# Patient Record
Sex: Female | Born: 1970 | Race: White | Hispanic: No | Marital: Single | Smoking: Current every day smoker
Health system: Southern US, Community
[De-identification: ages and names within clinical notes are randomized; demographics above are authoritative.]

---

## 2005-05-04 ENCOUNTER — Emergency Department (HOSPITAL_COMMUNITY): Admission: EM | Admit: 2005-05-04 | Discharge: 2005-05-04 | Payer: Self-pay | Admitting: Emergency Medicine

## 2009-10-29 ENCOUNTER — Ambulatory Visit (HOSPITAL_COMMUNITY): Admission: RE | Admit: 2009-10-29 | Discharge: 2009-10-29 | Payer: Self-pay | Admitting: Obstetrics & Gynecology

## 2009-11-08 ENCOUNTER — Encounter: Admission: RE | Admit: 2009-11-08 | Discharge: 2009-11-08 | Payer: Self-pay | Admitting: Obstetrics & Gynecology

## 2010-08-14 ENCOUNTER — Inpatient Hospital Stay (HOSPITAL_COMMUNITY): Admission: AD | Admit: 2010-08-14 | Discharge: 2010-08-14 | Payer: Self-pay | Admitting: Obstetrics and Gynecology

## 2010-08-27 ENCOUNTER — Ambulatory Visit (HOSPITAL_COMMUNITY): Admission: RE | Admit: 2010-08-27 | Discharge: 2010-08-27 | Payer: Self-pay | Admitting: Obstetrics and Gynecology

## 2010-11-06 ENCOUNTER — Ambulatory Visit (HOSPITAL_COMMUNITY)
Admission: RE | Admit: 2010-11-06 | Discharge: 2010-11-06 | Payer: Self-pay | Source: Home / Self Care | Admitting: Obstetrics and Gynecology

## 2010-12-04 ENCOUNTER — Ambulatory Visit (HOSPITAL_COMMUNITY)
Admission: RE | Admit: 2010-12-04 | Discharge: 2010-12-04 | Payer: Self-pay | Source: Home / Self Care | Admitting: Obstetrics and Gynecology

## 2011-01-22 ENCOUNTER — Inpatient Hospital Stay (HOSPITAL_COMMUNITY)
Admission: AD | Admit: 2011-01-22 | Discharge: 2011-01-26 | Payer: Self-pay | Source: Home / Self Care | Attending: Obstetrics and Gynecology | Admitting: Obstetrics and Gynecology

## 2011-01-22 LAB — CBC
HCT: 34.2 % — ABNORMAL LOW (ref 36.0–46.0)
Hemoglobin: 11.9 g/dL — ABNORMAL LOW (ref 12.0–15.0)
MCHC: 34.8 g/dL (ref 30.0–36.0)
MCV: 90 fL (ref 78.0–100.0)
RDW: 13.6 % (ref 11.5–15.5)

## 2011-01-24 LAB — CBC
HCT: 29.1 % — ABNORMAL LOW (ref 36.0–46.0)
MCHC: 34.7 g/dL (ref 30.0–36.0)
MCV: 90.1 fL (ref 78.0–100.0)
Platelets: 244 10*3/uL (ref 150–400)
WBC: 28.3 10*3/uL — ABNORMAL HIGH (ref 4.0–10.5)

## 2011-01-26 LAB — CBC
MCHC: 34.4 g/dL (ref 30.0–36.0)
Platelets: 288 10*3/uL (ref 150–400)
RBC: 3.13 MIL/uL — ABNORMAL LOW (ref 3.87–5.11)
WBC: 11.5 10*3/uL — ABNORMAL HIGH (ref 4.0–10.5)

## 2011-01-28 NOTE — H&P (Signed)
Breanna Rose, Breanna Rose            ACCOUNT NO.:  [id number]  MEDICAL RECORD NO.:  [id number]          PATIENT TYPE:  INP  LOCATION:  9102                          F[facility]ILITY:  [facility]  PHYSICIAN:  Janine Limbo, M.D.DATE OF BIRTH:  August 18, 1971  DATE OF ADMISSION:  01/22/2011 DATE OF DISCHARGE:                             HISTORY & PHYSICAL   HISTORY OF PRESENT ILLNESS:  Breanna Rose is a 40 year old female, gravida 2, para 1-0-1-1, who presents at 48 weeks and 4 days' gestation ([facility] is January 25, 2011).  The patient has been followed at the [facility] and Gynecology Division of [facility] for women.  The patient is sent for induction of labor because of a deceleration on her nonstress test and because of polyhydramnios.  The patient's risk factors include her age of 77.  She did have a quad screen which showed an increased risk of Down syndrome.  She declined amniocentesis.  She was noted to have polyhydramnios in the third trimester.  Her antenatal testing has been normal until today when she was found to have a deceleration.  The patient was sent for induction. The patient is Rh negative and she received RhoGAM at 28 weeks' gestation.  She is a cigarette smoker.  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  She was treated at the Maternity Admissions area on August 09, 2010.  Her initial titer was 1:1.  She then had a nonreactive RPR on November 06, 2010.  The patient admits to regular cigarette use and daily marijuana use.  She has a history of depression and may need social stresses.  OBSTETRICAL HISTORY:  The patient had a vaginal delivery at term in 1990, where she delivered a 7-pound 5-ounce female infant.  DRUG ALLERGIES:  No known drug allergies.  SOCIAL HISTORY:  The patient admits to regular marijuana use and cigarette use.  REVIEW OF SYSTEMS:  Normal pregnancy complaints.  FAMILY HISTORY:   Noncontributory.  PHYSICAL EXAMINATION:  HEENT:  Within normal limits. CHEST:  Clear. HEART:  Regular rate and rhythm. BREASTS:  Without masses. ABDOMEN:  Gravid. EXTREMITIES:  Grossly normal. NEUROLOGIC:  Grossly normal. PELVIC:  Cervix is 3-cm dilated, 80% effaced, and -3 in station.  Fetal heart rate tracing is category one.  The patient is having irregular and mild contractions.  LABORATORY VALUES:  Blood type is A negative, antibody screen negative, VDRL is nonreactive, rubella is positive, HBsAg negative, HIV is negative and beta strep in the third trimester is negative.  ASSESSMENT: 1. A 39-week and 4-day gestation. 2. Polyhydramnios. 3. Fetal heart rate tracing deceleration in the office, but now     category one tracing. 4. Positive syphilis test earlier in the pregnancy, but now negative. 5. Regular cigarette and marijuana use. 6. Multiple social stresses.  PLAN:  The patient will undergo induction of labor.  We will begin Pitocin and then rupture her membranes once she is having steady contractions.  We will obtain a Child psychotherapist consult prior to discharge.     Janine Limbo, M.D.     AVS/MEDQ  D:  01/22/2011  T:  01/23/2011  Job:  [id number]  Electronically Signed by Kirkland Hun M.D. on 01/28/2011 12:10:42 PM

## 2011-01-28 NOTE — Op Note (Signed)
Breanna Rose, Breanna Rose            ACCOUNT NO.:  [id number]  MEDICAL RECORD NO.:  [id number]          PATIENT TYPE:  INP  LOCATION:  9102                          [facility]  [facility]  PHYSICIAN:  Janine Limbo, M.D.DATE OF BIRTH:  12-01-1971  DATE OF PROCEDURE:  01/23/2011 DATE OF DISCHARGE:                              OPERATIVE REPORT   PREOPERATIVE DIAGNOSES: 1. Term intrauterine gestation. 2. Failure to progress in labor. 3. Desires sterilization.  POSTOPERATIVE DIAGNOSES: 1. Term intrauterine gestation. 2. Failure to progress in labor. 3. Desires sterilization. 4. Left occiput transverse presentation.  PROCEDURES:  Primary low transverse cesarean section and bilateral tubal ligation.  SURGEON:  Janine Limbo, MD  FIRST ASSISTANT:  Dorathy Kinsman, CNM  ANESTHETIC:  Epidural.  DISPOSITION:  Ms. Baldonado is a 40 year old female, gravida 2, para 1-0- 0-1, who presents at 16 weeks and 4 days' gestation ([facility] is January 25, 2011).  The patient has been followed at the [facility] and Gynecology Division of [facility].  The pregnancy has been complicated by the fact that her age is greater than 41 years.  The patient was treated for syphilis during the pregnancy. She was also treated for a positive RPR during this labor.  The patient was noted to have polyhydramnios.  She was noted to have fetal heart rate decelerations at antenatal testing.  The decision was made to admit the patient for induction of labor on January 22, 2011.  The patient was started on Pitocin.  Her membranes were ruptured.  She progressed very slowly during her labor.  She was noted to have edema on her cervix. She was also noted to have variable and occasional late decelerations. She at one point had dilated her cervix to 8-9 cm and the fetal head was at a +1 station.  The edematous cervix began to expand.  At 4:41 a.m., the cervix was noted to be 7-8 cm  dilated but the edematous cervix had progressed to the point that the cervix was almost palpable at the introitus and edema was noted around the full circumference of the cervix.  A cesarean delivery was then recommended.  The risk and benefits were reviewed which include, but are not limited to, anesthetic complications, bleeding, infections, and possible damage to the surrounding organs.  We also discussed the benefits and the risk associated with tubal sterilization.  She accepted the risk of tubal failure (17/1000).  FINDINGS:  A 7-pound female infant Renaldo Fiddler) was delivered from a left occiput transverse presentation.  The Apgar scores were 8 at 1 minute and 9 at 5 minutes.  A nuchal cord was present.  The placenta was noted to have meconium staining.  The fallopian tubes, the ovaries, and the uterus were otherwise normal for the gravid state.  PROCEDURE IN DETAILS:  The patient was taken to the operating room where it was determined that the epidural she had received for labor would be adequate for cesarean delivery.  The patient's abdomen was prepped with multiple layers of ChloraPrep.  A Foley catheter had previously been placed.  The patient was sterilely draped.  The lower abdomen was  injected with 10 mL of 0.5% Marcaine.  A low transverse incision was made in the abdomen and it was carried sharply through the subcutaneous tissue, the fascia, and the anterior peritoneum.  An incision was made in the lower uterine segment and extended in a low-transverse fashion. The fetal head was delivered without difficulty.  The mouth and nose were suctioned.  The nuchal cord was reduced.  The remainder of the infant was then delivered.  The cord was clamped and cut and the infant was handed to the awaiting pediatric team.  Routine cord blood studies were obtained.  The placenta was removed.  The uterine cavity was cleaned of amniotic fluid, clotted blood, and membranes.  The  uterine incision was closed using a running locking suture of 2-0 Vicryl followed by an imbricating suture of 2-0 Vicryl.  Hemostasis was achieved using a figure-of-eight suture of 2-0 Vicryl.  Hemostasis was noted to be adequate.  The abdominal cavity was then vigorously irrigated.  The left fallopian tube was identified and followed to its fimbriated end.  A knuckle of tube was made on the left using 2 free ties of 0 plain catgut suture.  The knuckle of tube thus made was excised.  Hemostasis was adequate.  An identical procedure was carried out on the opposite side.  Again hemostasis was adequate.  The anterior peritoneum and abdominal musculature were then reapproximated in the midline using 2-0 Vicryl.  The subcutaneous layer and the fascia were irrigated.  The fascia was closed using a running suture of 0 Vicryl followed by 3 interrupted sutures of 0 Vicryl.  The subcutaneous layer was closed using a running suture of 0 Vicryl.  The skin was reapproximated using a subcuticular suture of 3-0 Monocryl.  A pressure dressing was applied.  Sponge, needle, and instrument counts were correct on 2 occasions.  The estimated blood loss for the procedure was 800 mL.  The patient tolerated her procedure well.  The patient was then transported to the recovery room in stable condition.  The infant was taken to the full-term nursery in stable condition.  The cut portions of the left and right fallopian tube and the placenta was sent to Pathology for evaluation.     Janine Limbo, M.D.     AVS/MEDQ  D:  01/23/2011  T:  01/23/2011  Job:  [id number]  Electronically Signed by Kirkland Hun M.D. on 01/28/2011 12:10:51 PM

## 2011-03-11 LAB — RH IMMUNE GLOBULIN WORKUP (NOT WOMEN'S HOSP)
Antibody Screen: NEGATIVE
Unit division: 0
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
## 2014-03-20 ENCOUNTER — Ambulatory Visit
Admission: RE | Admit: 2014-03-20 | Discharge: 2014-03-20 | Disposition: A | Discharge status: Home / Self Care | Payer: BC Managed Care – PPO | Source: Ambulatory Visit | Attending: Family Medicine | Admitting: Family Medicine

## 2014-03-20 ENCOUNTER — Other Ambulatory Visit: Payer: Self-pay | Admitting: Family Medicine

## 2014-03-20 DIAGNOSIS — R609 Edema, unspecified: Secondary | ICD-10-CM

## 2014-03-20 DIAGNOSIS — S6990XA Unspecified injury of unspecified wrist, hand and finger(s), initial encounter: Secondary | ICD-10-CM

## 2014-04-14 ENCOUNTER — Other Ambulatory Visit: Payer: Self-pay | Admitting: Family Medicine

## 2014-04-14 DIAGNOSIS — Z1231 Encounter for screening mammogram for malignant neoplasm of breast: Secondary | ICD-10-CM

## 2014-04-24 ENCOUNTER — Encounter (INDEPENDENT_AMBULATORY_CARE_PROVIDER_SITE_OTHER): Payer: Self-pay

## 2014-04-24 ENCOUNTER — Ambulatory Visit
Admission: RE | Admit: 2014-04-24 | Discharge: 2014-04-24 | Disposition: A | Discharge status: Home / Self Care | Payer: BC Managed Care – PPO | Source: Ambulatory Visit | Attending: Family Medicine | Admitting: Family Medicine

## 2014-04-24 DIAGNOSIS — Z1231 Encounter for screening mammogram for malignant neoplasm of breast: Secondary | ICD-10-CM

## 2014-04-26 ENCOUNTER — Other Ambulatory Visit: Payer: Self-pay | Admitting: Family Medicine

## 2014-04-26 DIAGNOSIS — R928 Other abnormal and inconclusive findings on diagnostic imaging of breast: Secondary | ICD-10-CM

## 2014-05-08 ENCOUNTER — Ambulatory Visit
Admission: RE | Admit: 2014-05-08 | Discharge: 2014-05-08 | Disposition: A | Discharge status: Home / Self Care | Payer: BC Managed Care – PPO | Source: Ambulatory Visit | Attending: Family Medicine | Admitting: Family Medicine

## 2014-05-08 DIAGNOSIS — R928 Other abnormal and inconclusive findings on diagnostic imaging of breast: Secondary | ICD-10-CM

## 2014-10-26 ENCOUNTER — Emergency Department (HOSPITAL_COMMUNITY)
Admission: EM | Admit: 2014-10-26 | Discharge: 2014-10-26 | Disposition: A | Discharge status: Home / Self Care | Payer: BC Managed Care – PPO | Attending: Emergency Medicine | Admitting: Emergency Medicine

## 2014-10-26 ENCOUNTER — Encounter (HOSPITAL_COMMUNITY): Payer: Self-pay | Admitting: Emergency Medicine

## 2014-10-26 DIAGNOSIS — J209 Acute bronchitis, unspecified: Secondary | ICD-10-CM | POA: Insufficient documentation

## 2014-10-26 DIAGNOSIS — R05 Cough: Secondary | ICD-10-CM | POA: Diagnosis present

## 2014-10-26 DIAGNOSIS — J40 Bronchitis, not specified as acute or chronic: Secondary | ICD-10-CM

## 2014-10-26 DIAGNOSIS — Z79899 Other long term (current) drug therapy: Secondary | ICD-10-CM | POA: Diagnosis not present

## 2014-10-26 DIAGNOSIS — Z720 Tobacco use: Secondary | ICD-10-CM | POA: Insufficient documentation

## 2014-10-26 MED ORDER — PREDNISONE 50 MG PO TABS: ORAL_TABLET | ORAL | Status: DC

## 2014-10-26 MED ORDER — ALBUTEROL SULFATE HFA 108 (90 BASE) MCG/ACT IN AERS: 1.0000 | INHALATION_SPRAY | Freq: Four times a day (QID) | RESPIRATORY_TRACT | Status: DC | PRN

## 2014-10-26 MED ORDER — HYDROCODONE-HOMATROPINE 5-1.5 MG/5ML PO SYRP: 5.0000 mL | ORAL_SOLUTION | Freq: Four times a day (QID) | ORAL | Status: DC | PRN

## 2014-10-26 MED ORDER — AZITHROMYCIN 250 MG PO TABS: ORAL_TABLET | ORAL | Status: DC

## 2014-10-26 NOTE — ED Provider Notes (Signed)
CSN: [id number]     Arrival date & time 10/26/14  [id number] History  This chart was scribed for Donnetta HutchingBrian Deklyn Trachtenberg, MD by Tonye RoyaltyJoshua Chen, ED Scribe. This patient was seen in room APA01/APA01 and the patient's care was started at 8:50 AM.    Chief Complaint  Patient presents with  . Cough   The history is provided by the patient. No language interpreter was used.    HPI Comments: Breanna Rose is a 43 y.o. female who presents to the Emergency Department complaining of congestion and cough with onset 1 week ago. She reports associated wheezing and shortness of breath. She states she is able to ambulate but feels weak. She states she is normally healthy and not prone to respiratory infections, though she smokes 1 pack of cigarettes a day. She states she is not working now but used to work for a [employer] company. She reports using cough medicine at home without improvement. She states she also has been using penicillin for the past few weeks because she had some teeth extracted.   History reviewed. No pertinent past medical history. Past Surgical History  Procedure Laterality Date  . Cesarean section     History reviewed. No pertinent family history. History  Substance Use Topics  . Smoking status: Never Smoker   . Smokeless tobacco: Not on file  . Alcohol Use: No   OB History   Grav Para Term Preterm Abortions TAB SAB Ect Mult Living                 Review of Systems A complete 10 system review of systems was obtained and all systems are negative except as noted in the HPI and PMH.    Allergies  Review of patient's allergies indicates no known allergies.  Home Medications   Prior to Admission medications   Medication Sig Start Date End Date Taking? Authorizing Provider  albuterol (PROVENTIL HFA;VENTOLIN HFA) 108 (90 BASE) MCG/ACT inhaler Inhale 1-2 puffs into the lungs every 6 (six) hours as needed for wheezing or shortness of breath. 10/26/14   Donnetta HutchingBrian Kiasia Chou, MD  azithromycin  (ZITHROMAX Z-PAK) 250 MG tablet 2 po day one, then 1 daily x 4 days 10/26/14   Donnetta HutchingBrian Shalina Norfolk, MD  HYDROcodone-homatropine [facility](HYCODAN) 5-1.5 MG/5ML syrup Take 5 mLs by mouth every 6 (six) hours as needed for cough. 10/26/14   Donnetta HutchingBrian Breyah Akhter, MD  predniSONE (DELTASONE) 50 MG tablet 1 tablet daily for 7 days 10/26/14   Donnetta HutchingBrian Daleon Willinger, MD   BP 136/73  Pulse 104  Temp(Src) 98.2 F (36.8 C)  Resp 20  Ht 5\' 1"  (1.549 m)  Wt 156 lb (70.761 kg)  BMI 29.49 kg/m2  SpO2 94%  LMP 09/24/2014 Physical Exam  Nursing note and vitals reviewed. Constitutional: She is oriented to person, place, and time. She appears well-developed and well-nourished.  HENT:  Head: Normocephalic and atraumatic.  Eyes: Conjunctivae and EOM are normal. Pupils are equal, round, and reactive to light.  Neck: Normal range of motion. Neck supple.  Cardiovascular: Normal rate, regular rhythm and normal heart sounds.   Pulmonary/Chest: Effort normal. No respiratory distress. She has wheezes (bilateral expiratory wheezes). She has no rales.  Abdominal: Soft. Bowel sounds are normal.  Musculoskeletal: Normal range of motion.  Neurological: She is alert and oriented to person, place, and time.  Skin: Skin is warm and dry.  Psychiatric: She has a normal mood and affect. Her behavior is normal.    ED Course  Procedures (including critical care time)  DIAGNOSTIC STUDIES: Oxygen Saturation is 94% on adequate, normal by my interpretation.    COORDINATION OF CARE: 8:53 AM Discussed treatment plan with patient at beside, including inhaler, antibiotic, and prescription cough syrup. The patient agrees with the plan and has no further questions at this time.   Labs Review Labs Reviewed - No data to display  Imaging Review No results found.   EKG Interpretation None      MDM   Final diagnoses:  Bronchitis    Patient is nontoxic-appearing. No respiratory distress. Normal vital signs. Symptoms have noted for at least one week. Rx  Zithromax, Hycodan cough syrup, albuterol inhaler, prednisone  I personally performed the services described in this documentation, which was scribed in my presence. The recorded information has been reviewed and is accurate.    Donnetta HutchingBrian Zayna Toste, MD 10/26/14 [phone]

## 2014-10-26 NOTE — ED Notes (Signed)
Pt c/o productive cough with yellow sputum and generalized body aches x 1 week.

## 2014-10-26 NOTE — Discharge Instructions (Signed)
Stop smoking. Prescription for antibiotic, inhaler, cough syrup, prednisone. Increase fluids.

## 2016-01-15 ENCOUNTER — Emergency Department (HOSPITAL_COMMUNITY): Payer: No Typology Code available for payment source

## 2016-01-15 ENCOUNTER — Emergency Department (HOSPITAL_COMMUNITY)
Admission: EM | Admit: 2016-01-15 | Discharge: 2016-01-15 | Disposition: A | Discharge status: Home / Self Care | Payer: No Typology Code available for payment source | Attending: Emergency Medicine | Admitting: Emergency Medicine

## 2016-01-15 ENCOUNTER — Encounter (HOSPITAL_COMMUNITY): Payer: Self-pay | Admitting: Emergency Medicine

## 2016-01-15 DIAGNOSIS — Z79899 Other long term (current) drug therapy: Secondary | ICD-10-CM | POA: Diagnosis not present

## 2016-01-15 DIAGNOSIS — S199XXA Unspecified injury of neck, initial encounter: Secondary | ICD-10-CM | POA: Diagnosis present

## 2016-01-15 DIAGNOSIS — R202 Paresthesia of skin: Secondary | ICD-10-CM | POA: Insufficient documentation

## 2016-01-15 DIAGNOSIS — Y92410 Unspecified street and highway as the place of occurrence of the external cause: Secondary | ICD-10-CM | POA: Insufficient documentation

## 2016-01-15 DIAGNOSIS — Y998 Other external cause status: Secondary | ICD-10-CM | POA: Insufficient documentation

## 2016-01-15 DIAGNOSIS — Y9389 Activity, other specified: Secondary | ICD-10-CM | POA: Insufficient documentation

## 2016-01-15 DIAGNOSIS — M542 Cervicalgia: Secondary | ICD-10-CM

## 2016-01-15 MED ORDER — ACETAMINOPHEN-CODEINE #3 300-30 MG PO TABS: 1.0000 | ORAL_TABLET | Freq: Four times a day (QID) | ORAL | Status: DC | PRN

## 2016-01-15 MED ORDER — DEXAMETHASONE 4 MG PO TABS: 4.0000 mg | ORAL_TABLET | Freq: Two times a day (BID) | ORAL | Status: DC

## 2016-01-15 MED ORDER — BACLOFEN 10 MG PO TABS: 10.0000 mg | ORAL_TABLET | Freq: Three times a day (TID) | ORAL | Status: AC
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
## 2016-08-02 ENCOUNTER — Emergency Department (HOSPITAL_COMMUNITY)
Admission: EM | Admit: 2016-08-02 | Discharge: 2016-08-02 | Disposition: A | Discharge status: Home / Self Care | Payer: Self-pay | Attending: Emergency Medicine | Admitting: Emergency Medicine

## 2016-08-02 ENCOUNTER — Encounter (HOSPITAL_COMMUNITY): Payer: Self-pay

## 2016-08-02 DIAGNOSIS — F17210 Nicotine dependence, cigarettes, uncomplicated: Secondary | ICD-10-CM | POA: Insufficient documentation

## 2016-08-02 DIAGNOSIS — M5412 Radiculopathy, cervical region: Secondary | ICD-10-CM | POA: Insufficient documentation

## 2016-08-02 MED ORDER — METHOCARBAMOL 500 MG PO TABS: 500.0000 mg | ORAL_TABLET | Freq: Two times a day (BID) | ORAL | 0 refills | Status: DC

## 2016-08-02 MED ORDER — METHYLPREDNISOLONE 4 MG PO TBPK: ORAL_TABLET | ORAL | 0 refills | Status: DC
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
## 2017-01-29 ENCOUNTER — Other Ambulatory Visit: Payer: Self-pay | Admitting: Family Medicine

## 2017-01-29 DIAGNOSIS — Z1231 Encounter for screening mammogram for malignant neoplasm of breast: Secondary | ICD-10-CM

## 2017-02-10 ENCOUNTER — Ambulatory Visit
Admission: RE | Admit: 2017-02-10 | Discharge: 2017-02-10 | Disposition: A | Discharge status: Home / Self Care | Payer: Managed Care, Other (non HMO) | Source: Ambulatory Visit | Attending: Family Medicine | Admitting: Family Medicine

## 2017-02-10 DIAGNOSIS — Z1231 Encounter for screening mammogram for malignant neoplasm of breast: Secondary | ICD-10-CM
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
## 2019-02-07 ENCOUNTER — Other Ambulatory Visit: Payer: Self-pay | Admitting: General Practice

## 2019-02-07 DIAGNOSIS — Z1231 Encounter for screening mammogram for malignant neoplasm of breast: Secondary | ICD-10-CM

## 2019-02-10 ENCOUNTER — Other Ambulatory Visit (HOSPITAL_BASED_OUTPATIENT_CLINIC_OR_DEPARTMENT_OTHER): Payer: Self-pay

## 2019-02-10 DIAGNOSIS — G4710 Hypersomnia, unspecified: Secondary | ICD-10-CM

## 2019-02-10 DIAGNOSIS — R0683 Snoring: Secondary | ICD-10-CM

## 2019-02-21 ENCOUNTER — Ambulatory Visit: Payer: Managed Care, Other (non HMO) | Attending: General Practice | Admitting: Neurology

## 2019-02-21 DIAGNOSIS — R0683 Snoring: Secondary | ICD-10-CM | POA: Diagnosis not present

## 2019-02-21 DIAGNOSIS — G4710 Hypersomnia, unspecified: Secondary | ICD-10-CM | POA: Diagnosis not present

## 2019-02-28 NOTE — Procedures (Signed)
  [facility] Arti Trang A. Gerilyn Pilgrim, MD     [email or web address]             HOME SLEEP TEST  LOCATION: [facility]  Patient Name: Breanna Rose, Breanna Rose Date: 02/21/2019 Gender: Female D.O.B: Sep 04, 1971 Age (years): 40 Referring Provider: Therisa Doyne NP Height (inches): 62 Interpreting Physician: Beryle Beams MD, ABSM Weight (lbs): 178 RPSGT: Peak, Robert BMI: 33 MRN: [id number] Neck Size: CLINICAL INFORMATION Sleep Study Type: HST     Indication for sleep study: Excessive Daytime Sleepiness, Snoring     Epworth Sleepiness Score: NA  SLEEP STUDY TECHNIQUE A multi-channel overnight portable sleep study was performed. The channels recorded were: nasal airflow, thoracic respiratory movement, and oxygen saturation with a pulse oximetry. Snoring was also monitored.  MEDICATIONS Patient self administered medications include: N/A.  Current Outpatient Medications:  .  acetaminophen-codeine (TYLENOL #3) 300-30 MG tablet, Take 1-2 tablets by mouth every 6 (six) hours as needed for moderate pain., Disp: 15 tablet, Rfl: 0 .  albuterol (PROVENTIL HFA;VENTOLIN HFA) 108 (90 BASE) MCG/ACT inhaler, Inhale 1-2 puffs into the lungs every 6 (six) hours as needed for wheezing or shortness of breath., Disp: 1 Inhaler, Rfl: 0 .  azithromycin (ZITHROMAX Z-PAK) 250 MG tablet, 2 po day one, then 1 daily x 4 days, Disp: 5 tablet, Rfl: 0 .  dexamethasone (DECADRON) 4 MG tablet, Take 1 tablet (4 mg total) by mouth 2 (two) times daily with a meal., Disp: 12 tablet, Rfl: 0 .  HYDROcodone-homatropine (HYCODAN) 5-1.5 MG/5ML syrup, Take 5 mLs by mouth every 6 (six) hours as needed for cough., Disp: 120 mL, Rfl: 0 .  methocarbamol (ROBAXIN) 500 MG tablet, Take 1 tablet (500 mg total) by mouth 2 (two) times daily., Disp: 20 tablet, Rfl: 0 .  methylPREDNISolone (MEDROL DOSEPAK) 4 MG TBPK tablet, Follow instructions on the box, Disp: 21 tablet, Rfl: 0 .  predniSONE (DELTASONE) 50 MG tablet, 1  tablet daily for 7 days, Disp: 7 tablet, Rfl: 0   SLEEP ARCHITECTURE Patient was studied for 427 minutes. The sleep efficiency was 89.0 % and the patient was supine for 3%. The arousal index was 0.0 per hour.  RESPIRATORY PARAMETERS The overall AHI was 1.4 per hour, with a central apnea index of 0.1 per hour.  The oxygen nadir was 83% during sleep.     CARDIAC DATA Mean heart rate during sleep was 73.5 bpm.  IMPRESSIONS No significant obstructive sleep apnea occurred during this study (AHI = 1.4/h).   Argie Ramming, MD Diplomate, American Board of Sleep Medicine. ELECTRONICALLY SIGNED ON:  02/28/2019, 8:37 AM [facility] PH: [phone]   FX: [phone] ACCREDITED BY THE AMERICAN ACADEMY OF SLEEP MEDICINE

## 2019-03-04 ENCOUNTER — Ambulatory Visit
Admission: RE | Admit: 2019-03-04 | Discharge: 2019-03-04 | Disposition: A | Discharge status: Home / Self Care | Payer: Managed Care, Other (non HMO) | Source: Ambulatory Visit | Attending: General Practice | Admitting: General Practice

## 2019-03-04 DIAGNOSIS — Z1231 Encounter for screening mammogram for malignant neoplasm of breast: Secondary | ICD-10-CM

## 2022-09-30 ENCOUNTER — Ambulatory Visit: Payer: Managed Care, Other (non HMO) | Admitting: Nurse Practitioner

## 2022-09-30 ENCOUNTER — Encounter: Payer: Self-pay | Admitting: Nurse Practitioner

## 2022-09-30 VITALS — BP 137/86 | HR 69 | Temp 98.6°F | Ht 62.0 in | Wt 167.0 lb

## 2022-09-30 DIAGNOSIS — Z23 Encounter for immunization: Secondary | ICD-10-CM

## 2022-09-30 DIAGNOSIS — Z0000 Encounter for general adult medical examination without abnormal findings: Secondary | ICD-10-CM | POA: Insufficient documentation

## 2022-09-30 DIAGNOSIS — J301 Allergic rhinitis due to pollen: Secondary | ICD-10-CM | POA: Diagnosis not present

## 2022-09-30 DIAGNOSIS — Z7689 Persons encountering health services in other specified circumstances: Secondary | ICD-10-CM

## 2022-09-30 MED ORDER — CETIRIZINE HCL 10 MG PO TABS: 10.0000 mg | ORAL_TABLET | Freq: Every day | ORAL | 11 refills | Status: DC

## 2022-09-30 MED ORDER — SALINE SPRAY 0.65 % NA SOLN: 1.0000 | NASAL | 6 refills | Status: DC | PRN

## 2022-09-30 NOTE — Assessment & Plan Note (Signed)
Patient is establishing care today.  Completed head to toe assessment, provided education to patient on preventative care.  Patient will schedule an appointment for physical and labs, Pap, mammogram and colonoscopy.

## 2022-09-30 NOTE — Patient Instructions (Signed)
Allergic Rhinitis, Adult  Allergic rhinitis is an allergic reaction that affects the mucous membrane inside the nose. The mucous membrane is the tissue that produces mucus. There are two types of allergic rhinitis: Seasonal. This type is also called hay fever and happens only during certain seasons. Perennial. This type can happen at any time of the year. Allergic rhinitis cannot be spread from person to person. This condition can be mild, moderate, or severe. It can develop at any age and may be outgrown. What are the causes? This condition is caused by allergens. These are things that can cause an allergic reaction. Allergens may differ for seasonal allergic rhinitis and perennial allergic rhinitis. Seasonal allergic rhinitis is triggered by pollen. Pollen can come from grasses, trees, and weeds. Perennial allergic rhinitis may be triggered by: Dust mites. Proteins in a pet's urine, saliva, or dander. Dander is dead skin cells from a pet. Smoke, mold, or car fumes. What increases the risk? You are more likely to develop this condition if you have a family history of allergies or other conditions related to allergies, including: Allergic conjunctivitis. This is inflammation of parts of the eyes and eyelids. Asthma. This condition affects the lungs and makes it hard to breathe. Atopic dermatitis or eczema. This is long term (chronic) inflammation of the skin. Food allergies. What are the signs or symptoms? Symptoms of this condition include: Sneezing or coughing. A stuffy nose (nasal congestion), itchy nose, or nasal discharge. Itchy eyes and tearing of the eyes. A feeling of mucus dripping down the back of your throat (postnasal drip). Trouble sleeping. Tiredness or fatigue. Headache. Sore throat. How is this diagnosed? This condition may be diagnosed with your symptoms, medical history, and physical exam. Your health care provider may check for related conditions, such  as: Asthma. Pink eye. This is eye inflammation caused by infection (conjunctivitis). Ear infection. Upper respiratory infection. This is an infection in the nose, throat, or upper airways. You may also have tests to find out which allergens trigger your symptoms. These may include skin tests or blood tests. How is this treated? There is no cure for this condition, but treatment can help control symptoms. Treatment may include: Taking medicines that block allergy symptoms, such as corticosteroids and antihistamines. Medicine may be given as a shot, nasal spray, or pill. Avoiding any allergens. Being exposed again and again to tiny amounts of allergens to help you build a defense against allergens (immunotherapy). This is done if other treatments have not helped. It may include: Allergy shots. These are injected medicines that have small amounts of allergen in them. Sublingual immunotherapy. This involves taking small doses of a medicine with allergen in it under your tongue. If these treatments do not work, your health care provider may prescribe newer, stronger medicines. Follow these instructions at home: Avoiding allergens Find out what you are allergic to and avoid those allergens. These are some things you can do to help avoid allergens: If you have perennial allergies: Replace carpet with wood, tile, or vinyl flooring. Carpet can trap dander and dust. Do not smoke. Do not allow smoking in your home. Change your heating and air conditioning filters at least once a month. If you have seasonal allergies, take these steps during allergy season: Keep windows closed as much as possible. Plan outdoor activities when pollen counts are lowest. Check pollen counts before you plan outdoor activities. When coming indoors, change clothing and shower before sitting on furniture or bedding. If you have a pet   in the house that produces allergens: Keep the pet out of the bedroom. Vacuum, sweep, and  dust regularly. General instructions Take over-the-counter and prescription medicines only as told by your health care provider. Drink enough fluid to keep your urine pale yellow. Keep all follow-up visits as told by your health care provider. This is important. Where to find more information American Academy of Allergy, Asthma & Immunology: www.aaaai.org Contact a health care provider if: You have a fever. You develop a cough that does not go away. You make whistling sounds when you breathe (wheeze). Your symptoms slow you down or stop you from doing your normal activities each day. Get help right away if: You have shortness of breath. This symptom may represent a serious problem that is an emergency. Do not wait to see if the symptom will go away. Get medical help right away. Call your local emergency services (911 in the [place] Do not drive yourself to the hospital. Summary Allergic rhinitis may be managed by taking medicines as directed and avoiding allergens. If you have seasonal allergies, keep windows closed as much as possible during allergy season. Contact your health care provider if you develop a fever or a cough that does not go away. This information is not intended to replace advice given to you by your health care provider. Make sure you discuss any questions you have with your health care provider. Document Revised: 02/03/2020 Document Reviewed: 12/13/2019 Elsevier Patient Education  2023 Elsevier Inc.  

## 2022-09-30 NOTE — Assessment & Plan Note (Signed)
Patient is establishing care today.  Assessment persistent neurologic rhinitis completed started patient on Zyrtec 10 mg tablet by mouth daily.,  Provided education to patient to increase hydration, saline nasal spray, humidifier or air purifier in the home as needed.  Follow-up with unresolved symptoms.

## 2022-09-30 NOTE — Progress Notes (Signed)
New Patient Note  RE: Breanna Rose MRN: [id number] DOB: 1971/10/26 Date of Office Visit: 09/30/2022  Chief Complaint: Establish Care and Nasal Congestion  History of Present Illness:  Allergic Rhinitis: Breanna Rose is establishing today and here for evaluation of possible allergic rhinitis. Patient's symptoms include nasal congestion. These symptoms are seasonal. Current triggers include exposure to no known precipitant. The patient has been suffering from these symptoms for approximately few days. The patient has tried prescription antihistamines with good relief of symptoms. Immunotherapy has never been tried. The patient has never had nasal polyps. The patient has no history of asthma. The patient does not suffer from frequent sinopulmonary infections. The patient has not had sinus surgery in the past. The patient has no history of eczema.    Assessment and Plan: Breanna Rose is a 51 y.o. female with: Seasonal allergic rhinitis due to pollen Patient is establishing care today.  Assessment persistent neurologic rhinitis completed started patient on Zyrtec 10 mg tablet by mouth daily.,  Provided education to patient to increase hydration, saline nasal spray, humidifier or air purifier in the home as needed.  Follow-up with unresolved symptoms.  Establishing care with new doctor, encounter for Patient is establishing care today.  Completed head to toe assessment, provided education to patient on preventative care.  Patient will schedule an appointment for physical and labs, Pap, mammogram and colonoscopy.  No follow-ups on file.   Diagnostics:   Past Medical History: Patient Active Problem List   Diagnosis Date Noted   Seasonal allergic rhinitis due to pollen 09/30/2022   Establishing care with new doctor, encounter for 09/30/2022   History reviewed. No pertinent past medical history. Past Surgical History: Past Surgical History:  Procedure Laterality Date   CESAREAN SECTION      Medication List:  Current Outpatient Medications  Medication Sig Dispense Refill   cetirizine (ZYRTEC) 10 MG tablet Take 1 tablet (10 mg total) by mouth daily. 30 tablet 11   sodium chloride (OCEAN) 0.65 % SOLN nasal spray Place 1 spray into both nostrils as needed for congestion. 60 mL 6   traMADol (ULTRAM) 50 MG tablet  (Patient not taking: Reported on 09/30/2022)     No current facility-administered medications for this visit.   Allergies: No Known Allergies Social History: Social History   Socioeconomic History   Marital status: Single    Spouse name: Not on file   Number of children: Not on file   Years of education: Not on file   Highest education level: Not on file  Occupational History   Not on file  Tobacco Use   Smoking status: Every Day    Packs/day: 1.00    Types: Cigarettes   Smokeless tobacco: Never  Vaping Use   Vaping Use: Never used  Substance and Sexual Activity   Alcohol use: No   Drug use: Yes    Types: Marijuana   Sexual activity: Yes  Other Topics Concern   Not on file  Social History Narrative   Not on file   Social Determinants of Health   Financial Resource Strain: Not on file  Food Insecurity: Not on file  Transportation Needs: Not on file  Physical Activity: Not on file  Stress: Not on file  Social Connections: Not on file       Family History: History reviewed. No pertinent family history.       Review of Systems  Constitutional: Negative.   HENT:  Positive for congestion and postnasal drip.   Eyes:  Negative.   Respiratory: Negative.    Gastrointestinal: Negative.   Genitourinary: Negative.   Musculoskeletal: Negative.   Skin: Negative.   Neurological: Negative.   Psychiatric/Behavioral: Negative.    All other systems reviewed and are negative.  Objective: BP 137/86   Pulse 69   Temp 98.6 F (37 C)   Ht 5\' 2"  (1.575 m)   Wt 167 lb (75.8 kg)   LMP 01/29/2017 (Exact Date)   SpO2 95%   BMI 30.54 kg/m  Body  mass index is 30.54 kg/m. Physical Exam Vitals and nursing note reviewed.  Constitutional:      Appearance: Normal appearance.  HENT:     Head: Normocephalic.     Right Ear: External ear normal.     Left Ear: External ear normal.     Nose: Congestion present.  Eyes:     Pupils: Pupils are equal, round, and reactive to light.  Cardiovascular:     Rate and Rhythm: Normal rate and regular rhythm.     Pulses: Normal pulses.     Heart sounds: Normal heart sounds.  Pulmonary:     Effort: Pulmonary effort is normal.     Breath sounds: Normal breath sounds.  Abdominal:     General: Bowel sounds are normal.  Skin:    General: Skin is warm.  Neurological:     Mental Status: She is alert and oriented to person, place, and time.  Psychiatric:        Behavior: Behavior normal.    The plan was reviewed with the patient/family, and all questions/concerned were addressed.  It was my pleasure to see Breanna Rose today and participate in her care. Please feel free to contact me with any questions or concerns.  Sincerely,  Jac Canavan NP [place]

## 2022-11-11 ENCOUNTER — Encounter: Payer: Self-pay | Admitting: Nurse Practitioner

## 2022-11-11 ENCOUNTER — Ambulatory Visit (INDEPENDENT_AMBULATORY_CARE_PROVIDER_SITE_OTHER): Payer: 59 | Admitting: Nurse Practitioner

## 2022-11-11 VITALS — BP 109/70 | HR 75 | Temp 98.6°F | Ht 62.0 in | Wt 170.0 lb

## 2022-11-11 DIAGNOSIS — Z0000 Encounter for general adult medical examination without abnormal findings: Secondary | ICD-10-CM | POA: Diagnosis not present

## 2022-11-11 DIAGNOSIS — Z1211 Encounter for screening for malignant neoplasm of colon: Secondary | ICD-10-CM | POA: Diagnosis not present

## 2022-11-11 NOTE — Patient Instructions (Signed)

## 2022-11-11 NOTE — Assessment & Plan Note (Signed)
Patient is in today for physical exam.  Completed head to toe assessment.  No new complaints.  Provided education to patient on health maintenance and preventative care.  All labs completed results pending.  Follow-up in 1 year.

## 2022-11-11 NOTE — Progress Notes (Signed)
Established Patient Office Visit  Subjective   Patient ID: Breanna Rose, female    DOB: [birth year]  Age: 51 y.o. MRN: [id number]  Chief Complaint  Patient presents with   Annual Exam    HPI .   Encounter for general adult medical examination Physical: Patient's last physical exam was 1 year ago .  Weight: Appropriate for height (BMI less than 27%) ;  Blood Pressure: Normal (BP less than 120/80) ;  Medical History: Patient history reviewed ; Family history reviewed ;  Allergies Reviewed: No change in current allergies ;  Medications Reviewed: Medications reviewed - no changes ;  Lipids: Normal lipid levels ; blood completed results pending Smoking: Life-long non-smoker ;  Physical Activity: Exercises at least 3 times per week ;  Alcohol/Drug Use: Is a non-drinker ; No illicit drug use ;  Patient is not afflicted from Stress Incontinence and Urge Incontinence  Safety: reviewed ; Patient wears a seat belt, has smoke detectors, has carbon monoxide detectors. and wears sunscreen with extended sun exposure. Dental Care: biannual cleanings, brushes and flosses daily. Ophthalmology/Optometry: Annual visit.  Hearing loss: none Vision impairments: Patient wears glasses Patient Active Problem List   Diagnosis Date Noted   Seasonal allergic rhinitis due to pollen 09/30/2022   Annual physical exam 09/30/2022   History reviewed. No pertinent past medical history. Past Surgical History:  Procedure Laterality Date   CESAREAN SECTION     Social History   Tobacco Use   Smoking status: Every Day    Packs/day: 1.00    Types: Cigarettes   Smokeless tobacco: Never  Vaping Use   Vaping Use: Never used  Substance Use Topics   Alcohol use: No   Drug use: Yes    Types: Marijuana   Social History   Socioeconomic History   Marital status: Single    Spouse name: Not on file   Number of children: 2   Years of education: Not on file   Highest education level: Not on file   Occupational History   Occupation: Secondary school teacher  Tobacco Use   Smoking status: Every Day    Packs/day: 1.00    Types: Cigarettes   Smokeless tobacco: Never  Vaping Use   Vaping Use: Never used  Substance and Sexual Activity   Alcohol use: No   Drug use: Yes    Types: Marijuana   Sexual activity: Yes  Other Topics Concern   Not on file  Social History Narrative   Not on file   Social Determinants of Health   Financial Resource Strain: Not on file  Food Insecurity: Not on file  Transportation Needs: Not on file  Physical Activity: Not on file  Stress: Not on file  Social Connections: Not on file  Intimate Partner Violence: Not on file   Family Status  Relation Name Status   Mother  Deceased   Father  Alive   Sister  Alive   Daughter  Alive   Daughter  Alive   MGM  Deceased   MGF  Deceased   PGM  Deceased   PGF  Deceased   Other  Deceased   Family History  Problem Relation Age of Onset   Diabetes Mother    Hypertension Mother    Cancer Mother    Hypertension Sister    Diabetes Sister    Heart disease Maternal Grandmother    Diabetes Maternal Grandmother    No Known Allergies    Review of Systems  Constitutional: Negative.  HENT: Negative.  Negative for ear discharge and hearing loss.   Eyes: Negative.   Respiratory: Negative.    Cardiovascular: Negative.   Gastrointestinal: Negative.   Genitourinary: Negative.   Skin: Negative.  Negative for itching and rash.  Neurological: Negative.   Psychiatric/Behavioral: Negative.    All other systems reviewed and are negative.     Objective:     BP 109/70   Pulse 75   Temp 98.6 F (37 C)   Ht _0  (1.575 m)   Wt 170 lb (77.1 kg)   LMP 01/29/2017 (Exact Date)   SpO2 96%   BMI 31.09 kg/m  BP Readings from Last 3 Encounters:  11/11/22 109/70  09/30/22 137/86  08/02/16 123/72   Wt Readings from Last 3 Encounters:  11/11/22 170 lb (77.1 kg)  09/30/22 167 lb (75.8 kg)  08/02/16 162 lb  (73.5 kg)      Physical Exam Vitals and nursing note reviewed.  Constitutional:      Appearance: Normal appearance. She is obese.  HENT:     Head: Normocephalic.     Right Ear: External ear normal. There is no impacted cerumen.     Left Ear: External ear normal.     Nose: Nose normal.     Mouth/Throat:     Mouth: Mucous membranes are moist.     Pharynx: Oropharynx is clear.  Eyes:     Conjunctiva/sclera: Conjunctivae normal.  Cardiovascular:     Rate and Rhythm: Normal rate and regular rhythm.     Pulses: Normal pulses.     Heart sounds: Normal heart sounds.  Pulmonary:     Effort: Pulmonary effort is normal.     Breath sounds: Normal breath sounds.  Abdominal:     General: Bowel sounds are normal.  Musculoskeletal:        General: Normal range of motion.  Skin:    General: Skin is warm.     Findings: No erythema or rash.  Neurological:     General: No focal deficit present.     Mental Status: She is alert and oriented to person, place, and time.  Psychiatric:        Mood and Affect: Mood normal.        Behavior: Behavior normal.      No results found for any visits on 11/11/22.  Last CBC Lab Results  Component Value Date   WBC 11.5 (H) 01/26/2011   HGB 9.8 (L) 01/26/2011   HCT 28.5 (L) 01/26/2011   MCV 91.1 01/26/2011   MCH 31.3 01/26/2011   RDW 13.5 01/26/2011   PLT 288 92/42/6834   Last metabolic panel No results found for: "GLUCOSE", "NA", "K", "CL", "CO2", "BUN", "CREATININE", "EGFR", "CALCIUM", "PHOS", "PROT", "ALBUMIN", "LABGLOB", "AGRATIO", "BILITOT", "ALKPHOS", "AST", "ALT", "ANIONGAP" Last lipids No results found for: "CHOL", "HDL", "LDLCALC", "LDLDIRECT", "TRIG", "CHOLHDL" Last hemoglobin A1c No results found for: "HGBA1C" Last thyroid functions No results found for: "TSH", "T3TOTAL", "T4TOTAL", "THYROIDAB" Last vitamin D No results found for: "25OHVITD2", "25OHVITD3", "VD25OH"    The ASCVD Risk score (Arnett DK, et al., 2019) failed to  calculate for the following reasons:   Cannot find a previous HDL lab   Cannot find a previous total cholesterol lab    Assessment & Plan:   Problem List Items Addressed This Visit       Other   Annual physical exam - Primary    Patient is in today for physical exam.  Completed head to toe assessment.  No new complaints.  Provided education to patient on health maintenance and preventative care.  All labs completed results pending.  Follow-up in 1 year.      Relevant Orders   HIV Antibody (routine testing w rflx)   Hepatitis C Antibody   CBC with Differential/Platelet   CMP14+EGFR   Lipid panel   TSH   Other Visit Diagnoses     Colon cancer screening       Relevant Orders   Cologuard       Return in about 1 year (around 11/12/2023) for annual physical exam.    Ivy Lynn, NP

## 2022-11-12 LAB — HIV ANTIBODY (ROUTINE TESTING W REFLEX): HIV Screen 4th Generation wRfx: NONREACTIVE

## 2022-11-12 LAB — CBC WITH DIFFERENTIAL/PLATELET
Basophils Absolute: 0.1 10*3/uL (ref 0.0–0.2)
Basos: 1 %
EOS (ABSOLUTE): 0.2 10*3/uL (ref 0.0–0.4)
Eos: 2 %
Hematocrit: 45.5 % (ref 34.0–46.6)
Hemoglobin: 15.6 g/dL (ref 11.1–15.9)
Immature Grans (Abs): 0 10*3/uL (ref 0.0–0.1)
Immature Granulocytes: 0 %
Lymphocytes Absolute: 4 10*3/uL — ABNORMAL HIGH (ref 0.7–3.1)
Lymphs: 40 %
MCH: 31.1 pg (ref 26.6–33.0)
MCHC: 34.3 g/dL (ref 31.5–35.7)
MCV: 91 fL (ref 79–97)
Monocytes Absolute: 0.6 10*3/uL (ref 0.1–0.9)
Monocytes: 6 %
Neutrophils Absolute: 5.2 10*3/uL (ref 1.4–7.0)
Neutrophils: 51 %
Platelets: 237 10*3/uL (ref 150–450)
RBC: 5.02 x10E6/uL (ref 3.77–5.28)
RDW: 13.9 % (ref 11.7–15.4)
WBC: 10 10*3/uL (ref 3.4–10.8)

## 2022-11-12 LAB — CMP14+EGFR
ALT: 28 IU/L (ref 0–32)
AST: 27 IU/L (ref 0–40)
Albumin/Globulin Ratio: 1.7 (ref 1.2–2.2)
Albumin: 4.4 g/dL (ref 3.8–4.9)
Alkaline Phosphatase: 73 IU/L (ref 44–121)
BUN/Creatinine Ratio: 13 (ref 9–23)
BUN: 10 mg/dL (ref 6–24)
Bilirubin Total: 0.5 mg/dL (ref 0.0–1.2)
CO2: 21 mmol/L (ref 20–29)
Calcium: 9.3 mg/dL (ref 8.7–10.2)
Chloride: 106 mmol/L (ref 96–106)
Creatinine, Ser: 0.8 mg/dL (ref 0.57–1.00)
Globulin, Total: 2.6 g/dL (ref 1.5–4.5)
Glucose: 86 mg/dL (ref 70–99)
Potassium: 4.2 mmol/L (ref 3.5–5.2)
Sodium: 140 mmol/L (ref 134–144)
Total Protein: 7 g/dL (ref 6.0–8.5)
eGFR: 88 mL/min/{1.73_m2} (ref >59)

## 2022-11-12 LAB — LIPID PANEL
Chol/HDL Ratio: 6.8 ratio — ABNORMAL HIGH (ref 0.0–4.4)
Cholesterol, Total: 271 mg/dL — ABNORMAL HIGH (ref 100–199)
HDL: 40 mg/dL (ref >39)
LDL Chol Calc (NIH): 179 mg/dL — ABNORMAL HIGH (ref 0–99)
Triglycerides: 268 mg/dL — ABNORMAL HIGH (ref 0–149)
VLDL Cholesterol Cal: 52 mg/dL — ABNORMAL HIGH (ref 5–40)

## 2022-11-12 LAB — TSH: TSH: 1.95 u[IU]/mL (ref 0.450–4.500)

## 2022-11-12 LAB — HEPATITIS C ANTIBODY: Hep C Virus Ab: NONREACTIVE

## 2022-12-16 LAB — COLOGUARD: COLOGUARD: NEGATIVE

## 2023-10-20 ENCOUNTER — Ambulatory Visit (INDEPENDENT_AMBULATORY_CARE_PROVIDER_SITE_OTHER): Payer: 59 | Admitting: Nurse Practitioner

## 2023-10-20 ENCOUNTER — Encounter: Payer: Self-pay | Admitting: Nurse Practitioner

## 2023-10-20 ENCOUNTER — Other Ambulatory Visit: Payer: Self-pay | Admitting: Nurse Practitioner

## 2023-10-20 ENCOUNTER — Ambulatory Visit (INDEPENDENT_AMBULATORY_CARE_PROVIDER_SITE_OTHER): Payer: 59

## 2023-10-20 VITALS — BP 133/81 | HR 80 | Temp 98.1°F | Resp 20 | Ht 62.0 in | Wt 176.0 lb

## 2023-10-20 DIAGNOSIS — N644 Mastodynia: Secondary | ICD-10-CM

## 2023-10-20 DIAGNOSIS — Z1231 Encounter for screening mammogram for malignant neoplasm of breast: Secondary | ICD-10-CM

## 2023-10-20 DIAGNOSIS — F17200 Nicotine dependence, unspecified, uncomplicated: Secondary | ICD-10-CM

## 2023-10-20 DIAGNOSIS — K219 Gastro-esophageal reflux disease without esophagitis: Secondary | ICD-10-CM

## 2023-10-20 DIAGNOSIS — Z23 Encounter for immunization: Secondary | ICD-10-CM | POA: Diagnosis not present

## 2023-10-20 DIAGNOSIS — R0789 Other chest pain: Secondary | ICD-10-CM

## 2023-10-20 DIAGNOSIS — M79644 Pain in right finger(s): Secondary | ICD-10-CM | POA: Diagnosis not present

## 2023-10-20 LAB — CBC WITH DIFFERENTIAL/PLATELET
Basophils Absolute: 0.1 10*3/uL (ref 0.0–0.2)
Basos: 1 %
EOS (ABSOLUTE): 0.2 10*3/uL (ref 0.0–0.4)
Eos: 1 %
Hematocrit: 46.6 % (ref 34.0–46.6)
Hemoglobin: 15.5 g/dL (ref 11.1–15.9)
Immature Grans (Abs): 0 10*3/uL (ref 0.0–0.1)
Immature Granulocytes: 0 %
Lymphocytes Absolute: 4.1 10*3/uL — ABNORMAL HIGH (ref 0.7–3.1)
Lymphs: 29 %
MCH: 30.9 pg (ref 26.6–33.0)
MCHC: 33.3 g/dL (ref 31.5–35.7)
MCV: 93 fL (ref 79–97)
Monocytes Absolute: 0.6 10*3/uL (ref 0.1–0.9)
Monocytes: 4 %
Neutrophils Absolute: 9.3 10*3/uL — ABNORMAL HIGH (ref 1.4–7.0)
Neutrophils: 65 %
Platelets: 249 10*3/uL (ref 150–450)
RBC: 5.01 x10E6/uL (ref 3.77–5.28)
RDW: 12.6 % (ref 11.7–15.4)
WBC: 14.3 10*3/uL — ABNORMAL HIGH (ref 3.4–10.8)

## 2023-10-20 LAB — CMP14+EGFR
ALT: 26 [IU]/L (ref 0–32)
AST: 23 [IU]/L (ref 0–40)
Albumin: 4.2 g/dL (ref 3.8–4.9)
Alkaline Phosphatase: 78 [IU]/L (ref 44–121)
BUN/Creatinine Ratio: 11 (ref 9–23)
BUN: 10 mg/dL (ref 6–24)
Bilirubin Total: 0.5 mg/dL (ref 0.0–1.2)
CO2: 18 mmol/L — ABNORMAL LOW (ref 20–29)
Calcium: 9.2 mg/dL (ref 8.7–10.2)
Chloride: 106 mmol/L (ref 96–106)
Creatinine, Ser: 0.89 mg/dL (ref 0.57–1.00)
Globulin, Total: 2.6 g/dL (ref 1.5–4.5)
Glucose: 102 mg/dL — ABNORMAL HIGH (ref 70–99)
Potassium: 4.3 mmol/L (ref 3.5–5.2)
Sodium: 141 mmol/L (ref 134–144)
Total Protein: 6.8 g/dL (ref 6.0–8.5)
eGFR: 77 mL/min/{1.73_m2} (ref >59)

## 2023-10-20 MED ORDER — OMEPRAZOLE 40 MG PO CPDR: 40.0000 mg | DELAYED_RELEASE_CAPSULE | Freq: Every day | ORAL | 3 refills | Status: DC

## 2023-10-20 NOTE — Progress Notes (Signed)
Subjective:    Patient ID: Breanna Rose, female    DOB: 09/10/1971, 52 y.o.   MRN: [id number]   Chief Complaint: Pain in breast area (Feels better when she burps/) and Pain in right hand   HPI Patient comes in today with several complaints: - Pain in right breast- started over a week ago. Has had in the past. No nipple discharge. No indention's and no masses. Pain comes and goes. Rates pain 3-4/10 most of the time.  - chest pain- lower border anteriorly. Belching makes pain go away. Started about 3 months ago and is intermittent.  - right hand pain- mainly along 5th finger laterally. Intermittent. Shooting pain when she bends her finger. Does not other her when she is typing at work.  - smoker and worries about lung cancer- her mom had lung cancer. Does not have will power  to quit.  Patient Active Problem List   Diagnosis Date Noted   Seasonal allergic rhinitis due to pollen 09/30/2022   Annual physical exam 09/30/2022       Review of Systems  Constitutional:  Negative for diaphoresis.  Eyes:  Negative for pain.  Respiratory:  Negative for shortness of breath.   Cardiovascular:  Negative for chest pain, palpitations and leg swelling.  Gastrointestinal:  Negative for abdominal pain.  Endocrine: Negative for polydipsia.  Skin:  Negative for rash.  Neurological:  Negative for dizziness, weakness and headaches.  Hematological:  Does not bruise/bleed easily.  All other systems reviewed and are negative.      Objective:   Physical Exam Vitals and nursing note reviewed.  Constitutional:      General: She is not in acute distress.    Appearance: Normal appearance. She is well-developed.  Neck:     Vascular: No carotid bruit or JVD.  Cardiovascular:     Rate and Rhythm: Normal rate and regular rhythm.     Heart sounds: Normal heart sounds.  Pulmonary:     Effort: Pulmonary effort is normal. No respiratory distress.     Breath sounds: Normal breath sounds. No wheezing  or rales.  Chest:     Chest wall: No tenderness.  Breasts:    Right: Normal. No inverted nipple, mass, nipple discharge, skin change or tenderness.     Left: Normal. No inverted nipple, mass, nipple discharge, skin change or tenderness.  Abdominal:     General: Bowel sounds are normal. There is no distension or abdominal bruit.     Palpations: Abdomen is soft. There is no hepatomegaly, splenomegaly, mass or pulsatile mass.     Tenderness: There is no abdominal tenderness.  Musculoskeletal:        General: Normal range of motion.     Cervical back: Normal range of motion and neck supple.     Comments: pain right fifth finger with full flexion  Lymphadenopathy:     Cervical: No cervical adenopathy.  Skin:    General: Skin is warm and dry.  Neurological:     Mental Status: She is alert and oriented to person, place, and time.     Deep Tendon Reflexes: Reflexes are normal and symmetric.  Psychiatric:        Behavior: Behavior normal.        Thought Content: Thought content normal.        Judgment: Judgment normal.    BP 133/81   Pulse 80   Temp 98.1 F (36.7 C) (Temporal)   Resp 20   Ht 5\' 2"  (1.575  m)   Wt 176 lb (79.8 kg)   LMP 01/29/2017 (Exact Date)   SpO2 94%   BMI 32.19 kg/m     EKG- NSR-Breanna Daphine Deutscher, FNP  Chest xray- normal-Preliminary reading by Paulene Floor, FNP  [facility]        Assessment & Plan:  Breanna Rose in today with chief complaint of Pain in breast area (Feels better when she burps/) and Pain in right hand   1. Smoker Smoking cessation encouraged - DG Chest 2 View - Ambulatory Referral for Lung Cancer Scre  2. Other chest pain - EKG 12-Lead - CBC with Differential/Platelet - CMP14+EGFR  3. Gastroesophageal reflux disease without esophagitis Avoid spicy foods Do not eat 2 hours prior to bedtime - omeprazole (PRILOSEC) 40 MG capsule; Take 1 capsule (40 mg total) by mouth daily.  Dispense: 30 capsule; Refill: 3  4. Pain of finger  of right hand Motrin OTC as needed  5. Breast pain, right Will order mammogram - MM Digital Screening; Future    The above assessment and management plan was discussed with the patient. The patient verbalized understanding of and has agreed to the management plan. Patient is aware to call the clinic if symptoms persist or worsen. Patient is aware when to return to the clinic for a follow-up visit. Patient educated on when it is appropriate to go to the emergency department.   Breanna Daphine Deutscher, FNP

## 2023-10-20 NOTE — Patient Instructions (Signed)

## 2023-11-17 ENCOUNTER — Ambulatory Visit
Admission: RE | Admit: 2023-11-17 | Discharge: 2023-11-17 | Disposition: A | Discharge status: Home / Self Care | Payer: Managed Care, Other (non HMO) | Source: Ambulatory Visit | Attending: Nurse Practitioner | Admitting: Nurse Practitioner

## 2023-11-17 DIAGNOSIS — Z1231 Encounter for screening mammogram for malignant neoplasm of breast: Secondary | ICD-10-CM

## 2023-11-19 ENCOUNTER — Other Ambulatory Visit: Payer: Self-pay | Admitting: *Deleted

## 2023-11-19 ENCOUNTER — Other Ambulatory Visit: Payer: Self-pay

## 2023-11-19 DIAGNOSIS — Z122 Encounter for screening for malignant neoplasm of respiratory organs: Secondary | ICD-10-CM

## 2023-11-19 DIAGNOSIS — Z87891 Personal history of nicotine dependence: Secondary | ICD-10-CM

## 2023-11-19 DIAGNOSIS — J301 Allergic rhinitis due to pollen: Secondary | ICD-10-CM

## 2023-11-19 DIAGNOSIS — F17210 Nicotine dependence, cigarettes, uncomplicated: Secondary | ICD-10-CM

## 2023-11-19 MED ORDER — CETIRIZINE HCL 10 MG PO TABS: 10.0000 mg | ORAL_TABLET | Freq: Every day | ORAL | 1 refills | Status: DC

## 2023-11-30 ENCOUNTER — Ambulatory Visit: Payer: Managed Care, Other (non HMO) | Admitting: Acute Care

## 2023-11-30 DIAGNOSIS — F17210 Nicotine dependence, cigarettes, uncomplicated: Secondary | ICD-10-CM | POA: Diagnosis not present

## 2023-11-30 NOTE — Patient Instructions (Signed)

## 2023-11-30 NOTE — Progress Notes (Addendum)
    Provider Attestation I agree with the documentation of the Shared Decision Making visit,  smoking cessation counseling if appropriate, and verification or eligibility for lung cancer screening as documented by the RN Nurse Navigator.   Lauraine PHEBE Lites, MSN, AGACNP-BC [facility] See Amion for personal pager PCCM on call pager [phone]   Virtual Visit via Telephone Note  I connected with Breanna Rose on 11/30/23 at 10:00 AM EST by telephone and verified that I am speaking with the correct person using two identifiers.  Location: Patient: Breanna Rose  Provider: Laneta Speaks, RN   I discussed the limitations, risks, security and privacy concerns of performing an evaluation and management service by telephone and the availability of in person appointments. I also discussed with the patient that there may be a patient responsible charge related to this service. The patient expressed understanding and agreed to proceed.  Shared Decision Making Visit Lung Cancer Screening Program [phone])   Eligibility: Age 52 y.o. Pack Years Smoking History Calculation 38 (# packs/per year x # years smoked) Recent History of coughing up blood  no Unexplained weight loss? no ( >Than 15 pounds within the last 6 months ) Prior History Lung / other cancer no (Diagnosis within the last 5 years already requiring surveillance chest CT Scans). Smoking Status Current Smoker Former Smokers: Years since quit: na  Quit Date: na  Visit Components: Discussion included one or more decision making aids. yes Discussion included risk/benefits of screening. yes Discussion included potential follow up diagnostic testing for abnormal scans. yes Discussion included meaning and risk of over diagnosis. yes Discussion included meaning and risk of False Positives. yes Discussion included meaning of total radiation exposure. yes  Counseling Included: Importance of  adherence to annual lung cancer LDCT screening. yes Impact of comorbidities on ability to participate in the program. yes Ability and willingness to under diagnostic treatment. yes  Smoking Cessation Counseling: Current Smokers:  Discussed importance of smoking cessation. yes Information about tobacco cessation classes and interventions provided to patient. yes Patient provided with ticket for LDCT Scan. no Symptomatic Patient. no  Counseling(Intermediate counseling: > three minutes) 99406 Diagnosis Code: Tobacco Use Z72.0 Asymptomatic Patient yes  Counseling (Intermediate counseling: > three minutes counseling) H9563 Former Smokers:  Discussed the importance of maintaining cigarette abstinence. yes Diagnosis Code: Personal History of Nicotine Dependence. S12.108 Information about tobacco cessation classes and interventions provided to patient. Yes Patient provided with ticket for LDCT Scan. no Written Order for Lung Cancer Screening with LDCT placed in Epic. Yes (CT Chest Lung Cancer Screening Low Dose W/O CM) [id number] Z12.2-Screening of respiratory organs Z87.891-Personal history of nicotine dependence   Laneta Speaks, RN

## 2023-12-09 ENCOUNTER — Ambulatory Visit (HOSPITAL_COMMUNITY)
Admission: RE | Admit: 2023-12-09 | Discharge: 2023-12-09 | Disposition: A | Discharge status: Home / Self Care | Payer: Managed Care, Other (non HMO) | Source: Ambulatory Visit | Attending: Nurse Practitioner | Admitting: Nurse Practitioner

## 2023-12-09 DIAGNOSIS — Z122 Encounter for screening for malignant neoplasm of respiratory organs: Secondary | ICD-10-CM | POA: Diagnosis present

## 2023-12-09 DIAGNOSIS — F17210 Nicotine dependence, cigarettes, uncomplicated: Secondary | ICD-10-CM

## 2023-12-09 DIAGNOSIS — Z87891 Personal history of nicotine dependence: Secondary | ICD-10-CM

## 2023-12-21 ENCOUNTER — Other Ambulatory Visit: Payer: Self-pay

## 2023-12-21 ENCOUNTER — Ambulatory Visit: Payer: Self-pay | Admitting: Nurse Practitioner

## 2023-12-21 DIAGNOSIS — F17210 Nicotine dependence, cigarettes, uncomplicated: Secondary | ICD-10-CM

## 2023-12-21 DIAGNOSIS — Z87891 Personal history of nicotine dependence: Secondary | ICD-10-CM

## 2023-12-21 DIAGNOSIS — Z122 Encounter for screening for malignant neoplasm of respiratory organs: Secondary | ICD-10-CM

## 2023-12-21 NOTE — Telephone Encounter (Signed)
Copied from CRM [id number]. Topic: Clinical - Lab/Test Results >> Dec 21, 2023 11:34 AM [name] wrote: Reason for CRM: Patient called in requesting CT scan test/lab results that was completed on 12/01/2023.  Chief Complaint: ct scan results Symptoms: na Frequency: na Pertinent Negatives: Patient denies na Disposition: [] ED /[] Urgent Care (no appt availability in office) / [] Appointment(In office/virtual)/ []  [facility]/ [] Home Care/ [] Refused Recommended Disposition /[] [facility]/ []  Follow-up with PCP Additional Notes: Message forwarded to office for patient call back on ct scan.  Results not available in my chart.  Please call patient back.   Reason for Disposition  Question about upcoming scheduled test, no triage required and triager able to answer question  Answer Assessment - Initial Assessment Questions 1. REASON FOR CALL or QUESTION: "What is your reason for calling today?" or "How can I best help you?" or "What question do you have that I can help answer?"     Results of ct scan.  Protocols used: Information Only Call - No Triage-A-AH

## 2023-12-21 NOTE — Telephone Encounter (Signed)
Patient contacted our office for CT results. Per PCP it appears that pulmonology ordered and results haven't been reviewed. Please review and advise patient

## 2023-12-21 NOTE — Telephone Encounter (Unsigned)
Copied from CRM [id number]. Topic: Clinical - Lab/Test Results >> Dec 21, 2023 11:34 AM [name] wrote: Reason for CRM: Patient called in requesting CT scan test/lab results that was completed on 12/01/2023.

## 2024-02-09 ENCOUNTER — Other Ambulatory Visit: Payer: Self-pay | Admitting: Nurse Practitioner

## 2024-02-09 DIAGNOSIS — K219 Gastro-esophageal reflux disease without esophagitis: Secondary | ICD-10-CM

## 2024-02-09 NOTE — Telephone Encounter (Signed)
MMM NTBS in April for 6 mos FU RF sent to pharmacy

## 2024-05-09 ENCOUNTER — Other Ambulatory Visit: Payer: Self-pay | Admitting: Nurse Practitioner

## 2024-05-09 DIAGNOSIS — K219 Gastro-esophageal reflux disease without esophagitis: Secondary | ICD-10-CM

## 2024-05-14 ENCOUNTER — Other Ambulatory Visit: Payer: Self-pay | Admitting: Nurse Practitioner

## 2024-05-14 DIAGNOSIS — J301 Allergic rhinitis due to pollen: Secondary | ICD-10-CM

## 2024-06-04 ENCOUNTER — Other Ambulatory Visit: Payer: Self-pay | Admitting: Nurse Practitioner

## 2024-06-04 DIAGNOSIS — K219 Gastro-esophageal reflux disease without esophagitis: Secondary | ICD-10-CM

## 2024-06-06 ENCOUNTER — Encounter: Payer: Self-pay | Admitting: Nurse Practitioner

## 2024-06-06 NOTE — Telephone Encounter (Signed)
 MMM pt NTBS 30-d given 05/09/24

## 2024-06-06 NOTE — Telephone Encounter (Signed)
 LMTCB to make an appt w/MMM to get more refills. Also, I sent a letter about this!

## 2024-06-16 ENCOUNTER — Ambulatory Visit (INDEPENDENT_AMBULATORY_CARE_PROVIDER_SITE_OTHER): Admitting: Nurse Practitioner

## 2024-06-16 ENCOUNTER — Encounter: Payer: Self-pay | Admitting: Nurse Practitioner

## 2024-06-16 VITALS — BP 112/70 | HR 83 | Temp 97.9°F | Ht 62.0 in | Wt 174.2 lb

## 2024-06-16 DIAGNOSIS — F17200 Nicotine dependence, unspecified, uncomplicated: Secondary | ICD-10-CM | POA: Diagnosis not present

## 2024-06-16 DIAGNOSIS — Z6831 Body mass index (BMI) 31.0-31.9, adult: Secondary | ICD-10-CM | POA: Diagnosis not present

## 2024-06-16 DIAGNOSIS — K219 Gastro-esophageal reflux disease without esophagitis: Secondary | ICD-10-CM | POA: Insufficient documentation

## 2024-06-16 MED ORDER — OMEPRAZOLE 40 MG PO CPDR: 40.0000 mg | DELAYED_RELEASE_CAPSULE | Freq: Every day | ORAL | 1 refills | Status: AC

## 2024-06-16 NOTE — Patient Instructions (Signed)

## 2024-06-16 NOTE — Progress Notes (Signed)
 Subjective:    Patient ID: Breanna Rose, female    DOB: 03-29-1971, 53 y.o.   MRN: [id number]   Chief Complaint: Medical Management of Chronic Issues    HPI:  Breanna Rose is a 53 y.o. who identifies as a female who was assigned female at birth.   Social history: Lives with: her daughter Work history: [employer]    Comes in today for follow up of the following chronic medical issues:  1. Gastroesophageal reflux disease without esophagitis Is on omperazole and is doing well.  2. smoker Smokes about 1 pack a day. Wants to quit. Low dose CT scan was done in dec 2024 and was clear.  3. BMI 32.0-32.9  No recent weight changes Wt Readings from Last 3 Encounters:  06/16/24 174 lb 3.2 oz (79 kg)  10/20/23 176 lb (79.8 kg)  11/11/22 170 lb (77.1 kg)   BMI Readings from Last 3 Encounters:  06/16/24 31.86 kg/m  10/20/23 32.19 kg/m  11/11/22 31.09 kg/m      New complaints: None today  No Known Allergies Outpatient Encounter Medications as of 06/16/2024  Medication Sig   EQ ALLERGY RELIEF, CETIRIZINE , 10 MG tablet Take 1 tablet by mouth once daily   omeprazole  (PRILOSEC) 40 MG capsule TAKE 1 CAPSULE BY MOUTH ONCE DAILY . APPOINTMENT REQUIRED FOR FUTURE REFILLS   No facility-administered encounter medications on file as of 06/16/2024.    Past Surgical History:  Procedure Laterality Date   CESAREAN SECTION      Family History  Problem Relation Age of Onset   Diabetes Mother    Hypertension Mother    Cancer Mother    Hypertension Sister    Diabetes Sister    Heart disease Maternal Grandmother    Diabetes Maternal Grandmother       Controlled substance contract: n/a     Review of Systems  Constitutional:  Negative for diaphoresis.  Eyes:  Negative for pain.  Respiratory:  Negative for shortness of breath.   Cardiovascular:  Negative for chest pain, palpitations and leg swelling.  Gastrointestinal:  Negative for abdominal pain.   Endocrine: Negative for polydipsia.  Skin:  Negative for rash.  Neurological:  Negative for dizziness, weakness and headaches.  Hematological:  Does not bruise/bleed easily.  All other systems reviewed and are negative.      Objective:   Physical Exam Vitals and nursing note reviewed.  Constitutional:      General: She is not in acute distress.    Appearance: Normal appearance. She is well-developed.  HENT:     Head: Normocephalic.     Right Ear: Tympanic membrane normal.     Left Ear: Tympanic membrane normal.     Nose: Nose normal.     Mouth/Throat:     Mouth: Mucous membranes are moist.   Eyes:     Pupils: Pupils are equal, round, and reactive to light.   Neck:     Vascular: No carotid bruit or JVD.   Cardiovascular:     Rate and Rhythm: Normal rate and regular rhythm.     Heart sounds: Normal heart sounds.  Pulmonary:     Effort: Pulmonary effort is normal. No respiratory distress.     Breath sounds: Normal breath sounds. No wheezing or rales.  Chest:     Chest wall: No tenderness.  Abdominal:     General: Bowel sounds are normal. There is no distension or abdominal bruit.     Palpations: Abdomen is soft. There  is no hepatomegaly, splenomegaly, mass or pulsatile mass.     Tenderness: There is no abdominal tenderness.   Musculoskeletal:        General: Normal range of motion.     Cervical back: Normal range of motion and neck supple.  Lymphadenopathy:     Cervical: No cervical adenopathy.   Skin:    General: Skin is warm and dry.   Neurological:     Mental Status: She is alert and oriented to person, place, and time.     Deep Tendon Reflexes: Reflexes are normal and symmetric.   Psychiatric:        Behavior: Behavior normal.        Thought Content: Thought content normal.        Judgment: Judgment normal.       BP 112/70   Pulse 83   Temp 97.9 F (36.6 C)   Ht 5' 2 (1.575 m)   Wt 174 lb 3.2 oz (79 kg)   LMP 01/29/2017 (Exact Date)   SpO2 95%    BMI 31.86 kg/m      Assessment & Plan:  Breanna Rose comes in today with chief complaint of Medical Management of Chronic Issues   Diagnosis and orders addressed:  1. Gastroesophageal reflux disease without esophagitis (Primary) Avoid spicy foods Do not eat 2 hours prior to bedtime  - omeprazole  (PRILOSEC) 40 MG capsule; Take 1 capsule (40 mg total) by mouth daily.  Dispense: 90 capsule; Refill: 1  2. Smoker Smoking cessation education given - CBC with Differential/Platelet - CMP14+EGFR  3. BMI 31.0-31.9,adult Discussed diet and exercise for person with BMI >25 Will recheck weight in 3-6 months    Labs pending Health Maintenance reviewed Diet and exercise encouraged  Follow up plan: 1 year   Mary-Margaret Gaylyn Keas, FNP

## 2024-06-17 ENCOUNTER — Ambulatory Visit: Payer: Self-pay | Admitting: Nurse Practitioner

## 2024-06-17 LAB — CBC WITH DIFFERENTIAL/PLATELET
Basophils Absolute: 0.1 10*3/uL (ref 0.0–0.2)
Basos: 1 %
EOS (ABSOLUTE): 0.2 10*3/uL (ref 0.0–0.4)
Eos: 2 %
Hematocrit: 45.9 % (ref 34.0–46.6)
Hemoglobin: 15.8 g/dL (ref 11.1–15.9)
Immature Grans (Abs): 0 10*3/uL (ref 0.0–0.1)
Immature Granulocytes: 0 %
Lymphocytes Absolute: 4.6 10*3/uL — ABNORMAL HIGH (ref 0.7–3.1)
Lymphs: 39 %
MCH: 31.2 pg (ref 26.6–33.0)
MCHC: 34.4 g/dL (ref 31.5–35.7)
MCV: 91 fL (ref 79–97)
Monocytes Absolute: 0.6 10*3/uL (ref 0.1–0.9)
Monocytes: 5 %
Neutrophils Absolute: 6.4 10*3/uL (ref 1.4–7.0)
Neutrophils: 53 %
Platelets: 263 10*3/uL (ref 150–450)
RBC: 5.06 x10E6/uL (ref 3.77–5.28)
RDW: 13.4 % (ref 11.7–15.4)
WBC: 11.8 10*3/uL — ABNORMAL HIGH (ref 3.4–10.8)

## 2024-06-17 LAB — CMP14+EGFR
ALT: 23 IU/L (ref 0–32)
AST: 27 IU/L (ref 0–40)
Albumin: 4.2 g/dL (ref 3.8–4.9)
Alkaline Phosphatase: 88 IU/L (ref 44–121)
BUN/Creatinine Ratio: 16 (ref 9–23)
BUN: 15 mg/dL (ref 6–24)
Bilirubin Total: 0.4 mg/dL (ref 0.0–1.2)
CO2: 19 mmol/L — ABNORMAL LOW (ref 20–29)
Calcium: 8.8 mg/dL (ref 8.7–10.2)
Chloride: 105 mmol/L (ref 96–106)
Creatinine, Ser: 0.94 mg/dL (ref 0.57–1.00)
Globulin, Total: 2.3 g/dL (ref 1.5–4.5)
Glucose: 72 mg/dL (ref 70–99)
Potassium: 4.1 mmol/L (ref 3.5–5.2)
Sodium: 139 mmol/L (ref 134–144)
Total Protein: 6.5 g/dL (ref 6.0–8.5)
eGFR: 72 mL/min/{1.73_m2} (ref >59)

## 2024-07-20 ENCOUNTER — Encounter: Payer: Self-pay | Admitting: Acute Care

## 2024-08-11 ENCOUNTER — Other Ambulatory Visit: Payer: Self-pay | Admitting: Nurse Practitioner

## 2024-08-11 DIAGNOSIS — J301 Allergic rhinitis due to pollen: Secondary | ICD-10-CM

## 2024-09-12 ENCOUNTER — Ambulatory Visit: Payer: Self-pay

## 2024-09-12 NOTE — Telephone Encounter (Signed)
 Appt scheduled for 09/14/2024

## 2024-09-12 NOTE — Telephone Encounter (Signed)
 FYI Only or Action Required?: Action required by provider: request for appointment.  Patient was last seen in primary care on 06/16/2024 by Gladis Mustard, FNP.  Called Nurse Triage reporting Sinusitis.  Symptoms began several days ago.  Interventions attempted: Rest, hydration, or home remedies.  Symptoms are: gradually worsening.  Triage Disposition: See HCP Within 4 Hours (Or PCP Triage)  Patient/caregiver understands and will follow disposition?:   Copied from CRM #[id number]. Topic: Clinical - Red Word Triage >> Sep 12, 2024 11:30 AM DeAngela L wrote: Red Word that prompted transfer to Nurse Triage: Patient has a sinus infection for a week and calling to ask if the nurse could call in a prescription for her sinus infection  She states all the pain meds taken has not helped, she is having light headedness, chest pain and chest pressure and also shortness of breath    Pt num [phone] (M) Reason for Disposition  [1] Dizziness caused by heat exposure, sudden standing, or poor fluid intake AND [2] no improvement after 2 hours of rest and fluids  [1] Sinus congestion as part of a cold AND [2] present < 10 days  [1] Chest pain(s) lasting a few seconds from coughing AND [2] persists > 3 days  Answer Assessment - Initial Assessment Questions Additional info: Patient refusing urgent/emergent evaluation, requesting to be scheduled with PCP on Wednesday.    1. DESCRIPTION: Describe your dizziness.     Slight lightheaded 2. LIGHTHEADED: Do you feel lightheaded? (e.g., somewhat faint, woozy, weak upon standing)     Lightheaded when standing too fast 3. VERTIGO: Do you feel like either you or the room is spinning or tilting? (i.e., vertigo)     No 4. SEVERITY: How bad is it?  Do you feel like you are going to faint? Can you stand and walk?     mild 5. ONSET:  When did the dizziness begin?     Wednesday 6. AGGRAVATING FACTORS: Does anything make it worse? (e.g.,  standing, change in head position)     Standing  7. HEART RATE: Can you tell me your heart rate? How many beats in 15 seconds?  (Note: Not all patients can do this.)        8. CAUSE: What do you think is causing the dizziness? (e.g., decreased fluids or food, diarrhea, emotional distress, heat exposure, new medicine, sudden standing, vomiting; unknown)     Unsure 9. RECURRENT SYMPTOM: Have you had dizziness before? If Yes, ask: When was the last time? What happened that time?     no 10. OTHER SYMPTOMS: Do you have any other symptoms? (e.g., fever, chest pain, vomiting, diarrhea, bleeding)       Head pressure, sinus pressure, chest pressure  Answer Assessment - Initial Assessment Questions 1. LOCATION: Where does it hurt?      Eyes and forehead, left sided 2. ONSET: When did the sinus pain start?  (e.g., hours, days)      Wednesday  3. SEVERITY: How bad is the pain?   (Scale 0-10; or none, mild, moderate or severe)     6/10 4. RECURRENT SYMPTOM: Have you ever had sinus problems before? If Yes, ask: When was the last time? and What happened that time?      yes 5. NASAL CONGESTION: Is the nose blocked? If Yes, ask: Can you open it or must you breathe through your mouth?     yes 6. NASAL DISCHARGE: Do you have discharge from your nose? If so ask, What  color?     Green  7. FEVER: Do you have a fever? If Yes, ask: What is it, how was it measured, and when did it start?      Hot flashes  8. OTHER SYMPTOMS: Do you have any other symptoms? (e.g., sore throat, cough, earache, difficulty breathing)     Mild Chest pressure that is relieved with breathing, mild lightheaded, productive green cough  Answer Assessment - Initial Assessment Questions 1. LOCATION: Where does it hurt?       center 2. RADIATION: Does the pain go anywhere else? (e.g., into neck, jaw, arms, back)     denies 3. ONSET: When did the chest pain begin? (Minutes, hours or days)       Wednesday 4. PATTERN: Does the pain come and go, or has it been constant since it started?  Does it get worse with exertion?      intermittent 5. DURATION: How long does it last (e.g., seconds, minutes, hours)     Few seconds 6. SEVERITY: How bad is the pain?  (e.g., Scale 1-10; mild, moderate, or severe)     Moderate-feels short of breath and heart racing with pressure 7. CARDIAC RISK FACTORS: Do you have any history of heart problems or risk factors for heart disease? (e.g., angina, prior heart attack; diabetes, high blood pressure, high cholesterol, smoker, or strong family history of heart disease)      8. PULMONARY RISK FACTORS: Do you have any history of lung disease?  (e.g., blood clots in lung, asthma, emphysema, birth control pills)      9. CAUSE: What do you think is causing the chest pain?     Sinus infection 10. OTHER SYMPTOMS: Do you have any other symptoms? (e.g., dizziness, nausea, vomiting, sweating, fever, difficulty breathing, cough)       Lightheaded, sinus pressure at eyes and forehead 11. PREGNANCY: Is there any chance you are pregnant? When was your last menstrual period?  Protocols used: Sinus Pain or Congestion-A-AH, Dizziness - Lightheadedness-A-AH, Chest Pain-A-AH

## 2024-09-14 ENCOUNTER — Ambulatory Visit: Admitting: Nurse Practitioner

## 2024-10-13 ENCOUNTER — Ambulatory Visit: Payer: Self-pay

## 2024-10-13 NOTE — Telephone Encounter (Signed)
 Apt scheduled.

## 2024-10-13 NOTE — Telephone Encounter (Signed)
 FYI Only or Action Required?: FYI only for provider.  Patient was last seen in primary care on 06/16/2024 by Gladis Mustard, FNP.  Called Nurse Triage reporting Anxiety.  Symptoms began to worsen several months ago.  Interventions attempted: Other: CBD; heating pad.  Symptoms are: right hip pain, left shoulder pain worse when lifting arm with numbness in left fingers when turning her head, middle back pain, insomnia, anxiety gradually worsening.  Triage Disposition: See PCP Within 2 Weeks (overriding See PCP When Office is Open (Within 3 Days))  Patient/caregiver understands and will follow disposition?: Yes            Copied from CRM #[id number]. Topic: Clinical - Red Word Triage >> Oct 13, 2024 10:41 AM Mia F wrote: Red Word that prompted transfer to Nurse Triage: Increased anxiety. Has been dealing with it for a while but is worsening. Not taking any meds for it. States life has become too stressful Reason for Disposition  MODERATE anxiety (e.g., persistent or frequent anxiety symptoms; interferes with sleep, school, or work)  Answer Assessment - Initial Assessment Questions Patient states she only has availability next Thursday 2pm or later. No DOD schedule available and PCP does not have available appointments that day. Scheduled with NP Oneil.  1. CONCERN: Did anything happen that prompted you to call today?      Everything is just getting to me more than it should.  2. ANXIETY SYMPTOMS: Can you describe how you (your loved one; patient) have been feeling? (e.g., tense, restless, panicky, anxious, keyed up, overwhelmed, sense of impending doom).      Very frustrated, stressed. Feels like anything is going to set her off. She states if she gets overwhelmed, her body feels like she is shutting down and gets tired and goes to sleep.  3. ONSET: How long have you been feeling this way? (e.g., hours, days, weeks)     Years, since a teenager. Worsening since 3  months when came off CBD.  4. SEVERITY: How would you rate the level of anxiety? (e.g., 0 - 10; or mild, moderate, severe).     Between moderate and severe. Patient not showing or expressing signs of panic attack.  5. FUNCTIONAL IMPAIRMENT: How have these feelings affected your ability to do daily activities? Have you had more difficulty than usual doing your normal daily activities? (e.g., getting better, same, worse; self-care, school, work, interactions)     Difficulty sleeping. Patient states she pushes herself to do what she does, regardless.   6. HISTORY: Have you felt this way before? Have you ever been diagnosed with an anxiety problem in the past? (e.g., generalized anxiety disorder, panic attacks, PTSD). If Yes, ask: How was this problem treated? (e.g., medicines, counseling, etc.)     Yes. Depression and anxiety since a teen. She states she has tried CBD but has not been taking it for 3 months due to it has THC and made her fail a drug test. She states CBD was helping. She has been on medications in the past and didn't like them but states she is willing try medications.  7. RISK OF HARM - SUICIDAL IDEATION: Do you ever have thoughts of hurting or killing yourself? If Yes, ask:  Do you have these feelings now? Do you have a plan on how you would do this?     No.  8. TREATMENT:  What has been done so far to treat this anxiety? (e.g., medicines, relaxation strategies). What has helped?  CBD has helped but she had to stop taking it.   9. THERAPIST: Do you have a counselor or therapist? If Yes, ask: What is their name?     No. She states she has not had positive results in the past with counselors or therapists.  10. POTENTIAL TRIGGERS: Do you drink caffeinated beverages (e.g., coffee, colas, teas), and how much daily? Do you drink alcohol or use any drugs? Have you started any new medicines recently?       Yes, drinks 2 cups of coffee and 2 sodas daily.  No alcohol or drug use. She states she smokes cigarettes. Vitamin D with Vitamin K is a new medication for her.  11. PATIENT SUPPORT: Who is with you now? Who do you live with? Do you have family or friends who you can talk to?        She lives with her 55 year old daughter. She talks to her oldest daughter about things.  12. OTHER SYMPTOMS: Do you have any other symptoms? (e.g., feeling depressed, trouble concentrating, trouble sleeping, trouble breathing, palpitations or fast heartbeat, chest pain, sweating, nausea, or diarrhea)       Insomnia/difficulty sleeping; (R) hip pain; (L) shoulder pain when raising the arm, feels like her previous pinched nerve and tingling in left fingers if she turns her head a certain way treating with heating pad, x 2-3 weeks; middle back pain. Denies chest pain, SOB, loss of bowel or bladder control, weakness.  Protocols used: Anxiety and Panic Attack-A-AH

## 2024-10-20 ENCOUNTER — Encounter: Payer: Self-pay | Admitting: Nurse Practitioner

## 2024-10-20 ENCOUNTER — Ambulatory Visit: Admitting: Family Medicine

## 2024-10-20 ENCOUNTER — Ambulatory Visit: Admitting: Nurse Practitioner

## 2024-10-20 VITALS — BP 129/77 | HR 83 | Temp 97.7°F | Ht 62.0 in | Wt 160.0 lb

## 2024-10-20 DIAGNOSIS — Z23 Encounter for immunization: Secondary | ICD-10-CM | POA: Diagnosis not present

## 2024-10-20 DIAGNOSIS — F339 Major depressive disorder, recurrent, unspecified: Secondary | ICD-10-CM

## 2024-10-20 DIAGNOSIS — F419 Anxiety disorder, unspecified: Secondary | ICD-10-CM | POA: Diagnosis not present

## 2024-10-20 MED ORDER — BUSPIRONE HCL 15 MG PO TABS: 15.0000 mg | ORAL_TABLET | Freq: Two times a day (BID) | ORAL | 2 refills | Status: AC

## 2024-10-20 MED ORDER — VILAZODONE HCL 10 MG PO TABS: 10.0000 mg | ORAL_TABLET | Freq: Every day | ORAL | 1 refills | Status: DC

## 2024-10-20 NOTE — Progress Notes (Signed)
 Subjective:    Patient ID: Breanna Rose, female    DOB: [birth year], 53 y.o.   MRN: [id number]   Chief Complaint: anxiety   Patient was last seen in June 2025 for chronic follow up. She did not mention at that time anything about anxiety. She has been using CBD gummies and they worked well for her. But they showed in drug screen at work so she had  to stop taking them. She has to take a drug test monthly and must stay clean to keep her job.   * patient has been on paxil and zoloft in past. As well a xanax. Really did not like haiw made her feel.   Anxiety Presents for initial visit. Onset was 1 to 5 years ago. The problem has been waxing and waning. Symptoms include depressed mood, irritability and nervous/anxious behavior. Symptoms occur constantly. The severity of symptoms is interfering with daily activities. Nothing aggravates the symptoms. The quality of sleep is fair. Nighttime awakenings: occasional.        10/20/2024    3:12 PM 06/16/2024    4:12 PM 10/20/2023    8:08 AM 11/11/2022   11:02 AM  GAD 7 : Generalized Anxiety Score  Nervous, Anxious, on Edge 3 1 1  0  Control/stop worrying 3 1 2  0  Worry too much - different things 3 2 2  0  Trouble relaxing 3 2 2  0  Restless 2 1 1  0  Easily annoyed or irritable 3 2 2  0  Afraid - awful might happen 2 1 2  0  Total GAD 7 Score 19 10 12  0  Anxiety Difficulty Somewhat difficult Somewhat difficult Somewhat difficult Not difficult at all       10/20/2024    3:12 PM 06/16/2024    4:12 PM 10/20/2023    8:07 AM  Depression screen PHQ 2/9  Decreased Interest 2 1 1   Down, Depressed, Hopeless 2 1 1   PHQ - 2 Score 4 2 2   Altered sleeping 3 2 2   Tired, decreased energy 2 2 2   Change in appetite 2 1 3   Feeling bad or failure about yourself  1 0 1  Trouble concentrating 1 2 1   Moving slowly or fidgety/restless 2 0 0  Suicidal thoughts 0 0 0  PHQ-9 Score 15 9 11   Difficult doing work/chores  Somewhat difficult Somewhat difficult      Patient Active Problem List   Diagnosis Date Noted   GERD (gastroesophageal reflux disease) 06/16/2024   Seasonal allergic rhinitis due to pollen 09/30/2022       Review of Systems  Constitutional:  Positive for irritability.  Psychiatric/Behavioral:  Positive for dysphoric mood. The patient is nervous/anxious.        Objective:   Physical Exam Constitutional:      Appearance: Normal appearance.  Cardiovascular:     Rate and Rhythm: Normal rate and regular rhythm.     Heart sounds: Normal heart sounds.  Pulmonary:     Effort: Pulmonary effort is normal.     Breath sounds: Normal breath sounds.  Skin:    General: Skin is warm.  Neurological:     General: No focal deficit present.     Mental Status: She is alert and oriented to person, place, and time.  Psychiatric:        Mood and Affect: Mood normal.        Behavior: Behavior normal.    BP 129/77   Pulse 83   Temp 97.7 F (36.5  C) (Temporal)   Ht 5' 2 (1.575 m)   Wt 160 lb (72.6 kg)   LMP 01/29/2017 (Exact Date)   SpO2 95%   BMI 29.26 kg/m         Assessment & Plan:   Judah Chevere in today with chief complaint of Stress (Was taking CBD but found out she couldn't pass a drug test so she stopped) and spot on forehead (Has been there for around 6 months)   1. Anxiety (Primary) Stress management - Vilazodone HCl (VIIBRYD) 10 MG TABS; Take 1 tablet (10 mg total) by mouth daily.  Dispense: 30 tablet; Refill: 1 - busPIRone (BUSPAR) 15 MG tablet; Take 1 tablet (15 mg total) by mouth 2 (two) times daily.  Dispense: 30 tablet; Refill: 2  2. Depression, recurrent Stress management Viibryd discussed    The above assessment and management plan was discussed with the patient. The patient verbalized understanding of and has agreed to the management plan. Patient is aware to call the clinic if symptoms persist or worsen. Patient is aware when to return to the clinic for a follow-up visit. Patient educated  on when it is appropriate to go to the emergency department.   Mary-Margaret Gladis, FNP

## 2024-10-20 NOTE — Patient Instructions (Signed)
 Vilazodone Tablets What is this medication? VILAZODONE (vil [place] oh done) treats depression. It increases the amount of serotonin in the brain, a substance that helps regulate mood. This medicine may be used for other purposes; ask your health care provider or pharmacist if you have questions. COMMON BRAND NAME(S): VIIBRYD What should I tell my care team before I take this medication? They need to know if you have any of these conditions: Bipolar disorder or a family history of bipolar disorder Glaucoma Liver disease Low levels of sodium in the blood Receiving electroconvulsive therapy (ECT) Seizures Suicidal thoughts, plans, or attempt by you or a family member An unusual or allergic reaction to vilazodone, other medications, foods, dyes, or preservatives Pregnant or trying to get pregnant Breastfeeding How should I use this medication? Take this medication by mouth with water. Take it as directed on the prescription label at the same time every day. Take it with food. Keep taking it unless your care team tells you to stop. A special MedGuide will be given to you by the pharmacist with each prescription and refill. Be sure to read this information carefully each time. Talk to your care team about the use of this medication in children. Special care may be needed. Overdosage: If you think you have taken too much of this medicine contact a poison control center or emergency room at once. NOTE: This medicine is only for you. Do not share this medicine with others. What if I miss a dose? If you miss a dose, take it as soon as you can. If it is almost time for your next dose, take only that dose. Do not take double or extra doses. What may interact with this medication? Do not take this medication with any of the following: Linezolid MAOIs, such as Marplan, Nardil, and Parnate Methylene blue (injected into a vein) This medication may also interact with the following: Aspirin and aspirin-like  medications Certain medications for depression, anxiety, or other mental health conditions Certain medications for migraines, such as sumatriptan Certain medications for seizures Lithium Medications that treat or prevent blood clots, such as warfarin NSAIDs, medications for pain and inflammation, such as ibuprofen or naproxen Opioids St. John's wort Stimulant medications for ADHD, weight loss, or staying awake Tryptophan This list may not describe all possible interactions. Give your health care provider a list of all the medicines, herbs, non-prescription drugs, or dietary supplements you use. Also tell them if you smoke, drink alcohol, or use illegal drugs. Some items may interact with your medicine. What should I watch for while using this medication? Visit your care team for regular checks on your progress. It may be some time before you see the benefit from this medication. This medication may cause thoughts of suicide or depression. This includes sudden changes in mood, behaviors, or thoughts. These changes can happen at any time but are more common in the beginning of treatment or after a change in dose. Call your care team right away if you experience these thoughts or worsening depression. This medication may affect your coordination, reaction time, or judgment. Do not drive or operate machinery until you know how this medication affects you. Sit up or stand slowly to reduce the risk of dizzy or fainting spells. Drinking alcohol with this medication can increase the risk of these side effects. Your mouth may get dry. Chewing sugarless gum or sucking hard candy and drinking plenty of water may help. Contact your care team if the problem does not go  away or is severe. What side effects may I notice from receiving this medication? Side effects that you should report to your care team as soon as possible: Allergic reactions--skin rash, itching, hives, swelling of the face, lips, tongue, or  throat Bleeding--bloody or black, tar-like stools, vomiting blood or brown material that looks like coffee grounds, red or dark brown urine, small red or purple spots on skin, unusual bruising or bleeding Irritability, confusion, fast or irregular heartbeat, muscle stiffness, twitching muscles, sweating, high fever, seizure, chills, vomiting, diarrhea, which may be signs of serotonin syndrome Low sodium level--muscle weakness, fatigue, dizziness, headache, confusion Seizures Sudden eye pain or change in vision such as blurry vision, seeing halos around lights, vision loss Thoughts of suicide or self-harm, worsening mood, feelings of depression Side effects that usually do not require medical attention (report to your care team if they continue or are bothersome): Change in sex drive or performance Diarrhea Dry mouth Headache Nausea Trouble sleeping Vomiting This list may not describe all possible side effects. Call your doctor for medical advice about side effects. You may report side effects to FDA at 1-800-FDA-1088. Where should I keep my medication? Keep out of the reach of children. Store at room temperature between 15 and 30 degrees C (59 to 86 degrees F). Throw away any unused medication after the expiration date. NOTE: This sheet is a summary. It may not cover all possible information. If you have questions about this medicine, talk to your doctor, pharmacist, or health care provider.  2024 Elsevier/Gold Standard (2023-05-06 00:00:00)

## 2024-11-03 ENCOUNTER — Other Ambulatory Visit: Payer: Self-pay | Admitting: Nurse Practitioner

## 2024-11-03 DIAGNOSIS — K219 Gastro-esophageal reflux disease without esophagitis: Secondary | ICD-10-CM

## 2024-11-03 NOTE — Telephone Encounter (Signed)
 Copied from CRM #[id number]. Topic: Clinical - Medication Refill >> Nov 03, 2024 11:40 AM Graeme ORN wrote: Medication: omeprazole  (PRILOSEC) 40 MG capsule  Has the patient contacted their pharmacy? No (Agent: If no, request that the patient contact the pharmacy for the refill. If patient does not wish to contact the pharmacy document the reason why and proceed with request.) (Agent: If yes, when and what did the pharmacy advise?) would like to change to 1 month supply - due to cost   This is the patient's preferred pharmacy:  [facility] [address] ([address]  - [address] [place] ([place]) [place] [address] Phone: [phone] Fax: [phone]  Is this the correct pharmacy for this prescription? Yes If no, delete pharmacy and type the correct one.   Has the prescription been filled recently? No - June  Is the patient out of the medication? Yes  Has the patient been seen for an appointment in the last year OR does the patient have an upcoming appointment? Yes 10/23  Can we respond through MyChart? No  Agent: Please be advised that Rx refills may take up to 3 business days. We ask that you follow-up with your pharmacy.

## 2024-11-07 NOTE — Telephone Encounter (Signed)
 TC to patient, this was sent in to pharmacy on 06/16/24 for 3 mos supply with a refill. Pt did get this filled on Saturday but she wanted it for a 1 mos supply instructed her that even though we send it in for 3 mos that pharmacy can split it up for a month at a time.

## 2024-11-17 ENCOUNTER — Ambulatory Visit: Payer: Self-pay | Admitting: Nurse Practitioner

## 2024-11-17 ENCOUNTER — Encounter: Payer: Self-pay | Admitting: Nurse Practitioner

## 2024-11-17 VITALS — BP 112/60 | HR 85 | Temp 98.6°F | Ht 62.0 in | Wt 165.0 lb

## 2024-11-17 DIAGNOSIS — F339 Major depressive disorder, recurrent, unspecified: Secondary | ICD-10-CM | POA: Diagnosis not present

## 2024-11-17 DIAGNOSIS — F419 Anxiety disorder, unspecified: Secondary | ICD-10-CM | POA: Diagnosis not present

## 2024-11-17 MED ORDER — VILAZODONE HCL 20 MG PO TABS: 20.0000 mg | ORAL_TABLET | Freq: Every day | ORAL | 1 refills | Status: AC

## 2024-11-17 NOTE — Patient Instructions (Signed)

## 2024-11-17 NOTE — Progress Notes (Signed)
 Subjective:    Patient ID: Breanna Rose, female    DOB: [birth year], 53 y.o.   MRN: [id number]   Chief Complaint: Anxiety (Recheck )   Anxiety Patient reports no chest pain, dizziness, palpitations or shortness of breath.      Patient was seen on 10/20/24 c/o anxiety.  We started her on viibryd  and buspar . Since starting meds she has been doing a lot better. Feels less anxious. No medication side effects.    11/17/2024    3:53 PM 10/20/2024    3:12 PM 06/16/2024    4:12 PM  Depression screen PHQ 2/9  Decreased Interest 1 2 1   Down, Depressed, Hopeless 1 2 1   PHQ - 2 Score 2 4 2   Altered sleeping 3 3 2   Tired, decreased energy 2 2 2   Change in appetite 1 2 1   Feeling bad or failure about yourself  1 1 0  Trouble concentrating 3 1 2   Moving slowly or fidgety/restless 0 2 0  Suicidal thoughts 0 0 0  PHQ-9 Score 12 15  9    Difficult doing work/chores Somewhat difficult  Somewhat difficult     Data saved with a previous flowsheet row definition      11/17/2024    3:53 PM 10/20/2024    3:12 PM 06/16/2024    4:12 PM 10/20/2023    8:08 AM  GAD 7 : Generalized Anxiety Score  Nervous, Anxious, on Edge 1 3 1 1   Control/stop worrying 1 3 1 2   Worry too much - different things 2 3 2 2   Trouble relaxing 1 3 2 2   Restless 1 2 1 1   Easily annoyed or irritable 2 3 2 2   Afraid - awful might happen 0 2 1 2   Total GAD 7 Score 8 19 10 12   Anxiety Difficulty Somewhat difficult Somewhat difficult Somewhat difficult Somewhat difficult     Patient Active Problem List   Diagnosis Date Noted   GERD (gastroesophageal reflux disease) 06/16/2024   Seasonal allergic rhinitis due to pollen 09/30/2022       Review of Systems  Constitutional:  Negative for diaphoresis.  Eyes:  Negative for pain.  Respiratory:  Negative for shortness of breath.   Cardiovascular:  Negative for chest pain, palpitations and leg swelling.  Gastrointestinal:  Negative for abdominal pain.  Endocrine:  Negative for polydipsia.  Skin:  Negative for rash.  Neurological:  Negative for dizziness, weakness and headaches.  Hematological:  Does not bruise/bleed easily.  All other systems reviewed and are negative.      Objective:   Physical Exam Constitutional:      Appearance: Normal appearance.  Cardiovascular:     Rate and Rhythm: Normal rate and regular rhythm.     Heart sounds: Normal heart sounds.  Pulmonary:     Breath sounds: Normal breath sounds.  Skin:    General: Skin is warm.  Neurological:     General: No focal deficit present.     Mental Status: She is alert and oriented to person, place, and time.  Psychiatric:        Mood and Affect: Mood normal.        Behavior: Behavior normal.    BP 112/60   Pulse 85   Temp 98.6 F (37 C) (Temporal)   Ht 5' 2 (1.575 m)   Wt 165 lb (74.8 kg)   LMP 01/29/2017 (Exact Date)   SpO2 96%   BMI 30.18 kg/m  Assessment & Plan:   Joleah Kosak in today with chief complaint of Anxiety (Recheck )   1. Anxiety (Primary) 2. Depression, recurrent Increased viibryd to 20mg  daily Do not miss medication - viibryd Side effects reviewed Continue buspar as needed Follow up in 6 months  Meds ordered this encounter  Medications   Vilazodone HCl (VIIBRYD) 20 MG TABS    Sig: Take 1 tablet (20 mg total) by mouth daily.    Dispense:  90 tablet    Refill:  1    Supervising Provider:   MARYANNE CHEW A [[id number]]     The above assessment and management plan was discussed with the patient. The patient verbalized understanding of and has agreed to the management plan. Patient is aware to call the clinic if symptoms persist or worsen. Patient is aware when to return to the clinic for a follow-up visit. Patient educated on when it is appropriate to go to the emergency department.   Mary-Margaret Gladis, FNP

## 2024-12-14 ENCOUNTER — Inpatient Hospital Stay: Admission: RE | Admit: 2024-12-14 | Discharge: 2024-12-14 | Attending: Acute Care | Admitting: Acute Care

## 2024-12-14 ENCOUNTER — Other Ambulatory Visit: Payer: Self-pay | Admitting: Nurse Practitioner

## 2024-12-14 DIAGNOSIS — F17210 Nicotine dependence, cigarettes, uncomplicated: Secondary | ICD-10-CM

## 2024-12-14 DIAGNOSIS — Z122 Encounter for screening for malignant neoplasm of respiratory organs: Secondary | ICD-10-CM

## 2024-12-14 DIAGNOSIS — F419 Anxiety disorder, unspecified: Secondary | ICD-10-CM

## 2024-12-14 DIAGNOSIS — Z87891 Personal history of nicotine dependence: Secondary | ICD-10-CM

## 2024-12-19 ENCOUNTER — Other Ambulatory Visit: Payer: Self-pay | Admitting: Acute Care

## 2024-12-19 DIAGNOSIS — Z87891 Personal history of nicotine dependence: Secondary | ICD-10-CM

## 2024-12-19 DIAGNOSIS — Z122 Encounter for screening for malignant neoplasm of respiratory organs: Secondary | ICD-10-CM

## 2025-01-13 ENCOUNTER — Other Ambulatory Visit: Payer: Self-pay | Admitting: Nurse Practitioner

## 2025-01-13 DIAGNOSIS — J301 Allergic rhinitis due to pollen: Secondary | ICD-10-CM

## 2025-01-17 ENCOUNTER — Other Ambulatory Visit: Payer: Self-pay | Admitting: Nurse Practitioner

## 2025-01-17 DIAGNOSIS — Z1231 Encounter for screening mammogram for malignant neoplasm of breast: Secondary | ICD-10-CM

## 2025-01-18 ENCOUNTER — Inpatient Hospital Stay
Admission: RE | Admit: 2025-01-18 | Discharge: 2025-01-18 | Attending: Nurse Practitioner | Admitting: Nurse Practitioner

## 2025-01-18 DIAGNOSIS — Z1231 Encounter for screening mammogram for malignant neoplasm of breast: Secondary | ICD-10-CM

## 2025-05-15 ENCOUNTER — Ambulatory Visit: Admitting: Nurse Practitioner
